# Patient Record
Sex: Female | Born: 1960 | Race: White | Hispanic: No | Marital: Married
Health system: Southern US, Community
[De-identification: ages and names within clinical notes are randomized; demographics above are authoritative.]

## PROBLEM LIST (undated history)

## (undated) DIAGNOSIS — J302 Other seasonal allergic rhinitis: Secondary | ICD-10-CM

## (undated) DIAGNOSIS — I10 Essential (primary) hypertension: Secondary | ICD-10-CM

---

## 2006-04-03 ENCOUNTER — Ambulatory Visit: Payer: Self-pay | Admitting: Specialist

## 2006-04-26 ENCOUNTER — Ambulatory Visit: Payer: Self-pay | Admitting: Internal Medicine

## 2007-06-07 ENCOUNTER — Emergency Department: Payer: Self-pay | Admitting: Emergency Medicine

## 2007-06-11 ENCOUNTER — Emergency Department: Payer: Self-pay | Admitting: Emergency Medicine

## 2008-01-24 ENCOUNTER — Ambulatory Visit: Payer: Self-pay | Admitting: Unknown Physician Specialty

## 2008-02-05 ENCOUNTER — Ambulatory Visit: Payer: Self-pay | Admitting: Unknown Physician Specialty

## 2010-11-03 ENCOUNTER — Ambulatory Visit: Payer: Self-pay | Admitting: Family Medicine

## 2011-09-09 ENCOUNTER — Ambulatory Visit: Payer: Self-pay | Admitting: Medical

## 2015-07-21 ENCOUNTER — Other Ambulatory Visit: Payer: Self-pay | Admitting: Unknown Physician Specialty

## 2015-07-21 DIAGNOSIS — Z1231 Encounter for screening mammogram for malignant neoplasm of breast: Secondary | ICD-10-CM

## 2017-05-15 ENCOUNTER — Other Ambulatory Visit: Payer: Self-pay | Admitting: Medical Oncology

## 2017-05-15 DIAGNOSIS — Z1239 Encounter for other screening for malignant neoplasm of breast: Secondary | ICD-10-CM

## 2018-10-29 ENCOUNTER — Other Ambulatory Visit: Payer: Self-pay | Admitting: Medical Oncology

## 2018-10-29 DIAGNOSIS — Z1231 Encounter for screening mammogram for malignant neoplasm of breast: Secondary | ICD-10-CM

## 2018-12-20 ENCOUNTER — Other Ambulatory Visit: Payer: Self-pay

## 2018-12-20 DIAGNOSIS — Z20822 Contact with and (suspected) exposure to covid-19: Secondary | ICD-10-CM

## 2018-12-22 LAB — NOVEL CORONAVIRUS, NAA: SARS-CoV-2, NAA: NOT DETECTED

## 2019-03-11 ENCOUNTER — Emergency Department
Admission: EM | Admit: 2019-03-11 | Discharge: 2019-03-11 | Disposition: A | Discharge status: Home / Self Care | Payer: BLUE CROSS/BLUE SHIELD | Attending: Emergency Medicine | Admitting: Emergency Medicine

## 2019-03-11 ENCOUNTER — Emergency Department: Payer: BLUE CROSS/BLUE SHIELD

## 2019-03-11 ENCOUNTER — Other Ambulatory Visit: Payer: Self-pay

## 2019-03-11 DIAGNOSIS — Y998 Other external cause status: Secondary | ICD-10-CM | POA: Insufficient documentation

## 2019-03-11 DIAGNOSIS — S022XXA Fracture of nasal bones, initial encounter for closed fracture: Secondary | ICD-10-CM | POA: Insufficient documentation

## 2019-03-11 DIAGNOSIS — R42 Dizziness and giddiness: Secondary | ICD-10-CM | POA: Diagnosis not present

## 2019-03-11 DIAGNOSIS — W19XXXA Unspecified fall, initial encounter: Secondary | ICD-10-CM

## 2019-03-11 DIAGNOSIS — Y92018 Other place in single-family (private) house as the place of occurrence of the external cause: Secondary | ICD-10-CM | POA: Diagnosis not present

## 2019-03-11 DIAGNOSIS — Y9301 Activity, walking, marching and hiking: Secondary | ICD-10-CM | POA: Diagnosis not present

## 2019-03-11 DIAGNOSIS — S0181XA Laceration without foreign body of other part of head, initial encounter: Secondary | ICD-10-CM | POA: Diagnosis not present

## 2019-03-11 DIAGNOSIS — S022XXB Fracture of nasal bones, initial encounter for open fracture: Secondary | ICD-10-CM

## 2019-03-11 DIAGNOSIS — W108XXA Fall (on) (from) other stairs and steps, initial encounter: Secondary | ICD-10-CM | POA: Diagnosis not present

## 2019-03-11 DIAGNOSIS — S0990XA Unspecified injury of head, initial encounter: Secondary | ICD-10-CM | POA: Diagnosis present

## 2019-03-11 DIAGNOSIS — R112 Nausea with vomiting, unspecified: Secondary | ICD-10-CM | POA: Insufficient documentation

## 2019-03-11 LAB — BASIC METABOLIC PANEL
Anion gap: 8 (ref 5–15)
BUN: 15 mg/dL (ref 6–20)
CO2: 25 mmol/L (ref 22–32)
Calcium: 9.2 mg/dL (ref 8.9–10.3)
Chloride: 103 mmol/L (ref 98–111)
Creatinine, Ser: 0.68 mg/dL (ref 0.44–1.00)
GFR calc Af Amer: >60 mL/min (ref >60)
GFR calc non Af Amer: >60 mL/min (ref >60)
Glucose, Bld: 96 mg/dL (ref 70–99)
Potassium: 3.9 mmol/L (ref 3.5–5.1)
Sodium: 136 mmol/L (ref 135–145)

## 2019-03-11 LAB — CBC
HCT: 38.2 % (ref 36.0–46.0)
Hemoglobin: 12.9 g/dL (ref 12.0–15.0)
MCH: 29.7 pg (ref 26.0–34.0)
MCHC: 33.8 g/dL (ref 30.0–36.0)
MCV: 88 fL (ref 80.0–100.0)
Platelets: 256 10*3/uL (ref 150–400)
RBC: 4.34 MIL/uL (ref 3.87–5.11)
RDW: 13.1 % (ref 11.5–15.5)
WBC: 5.3 10*3/uL (ref 4.0–10.5)
nRBC: 0 % (ref 0.0–0.2)

## 2019-03-11 MED ORDER — LIDOCAINE-EPINEPHRINE-TETRACAINE (LET) TOPICAL GEL
3.0000 mL | Freq: Once | TOPICAL | Status: AC
  Administered 2019-03-11: 3 mL via TOPICAL
  Filled 2019-03-11: qty 3

## 2019-03-11 MED ORDER — AMOXICILLIN-POT CLAVULANATE 875-125 MG PO TABS: 1.0000 | ORAL_TABLET | Freq: Two times a day (BID) | ORAL | 0 refills | Status: AC

## 2019-03-11 MED ORDER — TRAMADOL HCL 50 MG PO TABS: 50.0000 mg | ORAL_TABLET | Freq: Four times a day (QID) | ORAL | 0 refills | Status: DC | PRN

## 2019-03-11 MED ORDER — SODIUM CHLORIDE 0.9 % IV SOLN
1000.0000 mL | Freq: Once | INTRAVENOUS | Status: AC
  Administered 2019-03-11: 1000 mL via INTRAVENOUS

## 2019-03-11 NOTE — ED Notes (Signed)
Called pharmacy and they will send up LET

## 2019-03-11 NOTE — ED Notes (Signed)
MD Kinner at bedside  

## 2019-03-11 NOTE — ED Notes (Signed)
Pt's face cleaned up and 2x2 gauze placed to control bleeding. Pt given new masks

## 2019-03-11 NOTE — ED Notes (Signed)
Pt's face cleaned up and instructed on how to care for site.

## 2019-03-11 NOTE — ED Provider Notes (Signed)
[facility] Emergency Department Provider Note   ____________________________________________    I have reviewed the triage vital signs and the nursing notes.   HISTORY  Chief Complaint Fall     HPI Yolanda Nelson is a 59 y.o. female who presents after a fall.  Patient reports that she was walking quickly down the stairs to her basement to get setting for her son and lost her balance and fell forward onto her face.  She was unable to slow her fall.  She complains of nasal pain.  Upon arrival to the emergency department she reports that she became overly heated because she was wearing a sweater and anxious because of the situation and became very lightheaded.  She denies chest pain.  She did have one episode of nausea and vomiting.  Currently feels much better now that she is resting.  History reviewed. No pertinent past medical history.  There are no problems to display for this patient.   History reviewed. No pertinent surgical history.  Prior to Admission medications   Medication Sig Start Date End Date Taking? Authorizing Provider  amoxicillin-clavulanate (AUGMENTIN) 875-125 MG tablet Take 1 tablet by mouth 2 (two) times daily for 7 days. 03/11/19 03/18/19  Jene Every, MD  traMADol (ULTRAM) 50 MG tablet Take 1 tablet (50 mg total) by mouth every 6 (six) hours as needed. 03/11/19 03/10/20  Jene Every, MD     Allergies Patient has no allergy information on record.  No family history on file.  Social History No drugs or alcohol Review of Systems  Constitutional: No fever/chills Eyes: No visual changes.  ENT: Nasal pain Cardiovascular: Denies chest pain. Respiratory: Denies shortness of breath. Gastrointestinal: No abdominal pain.  Vomiting as above now improved Genitourinary: Negative for groin injury Musculoskeletal: Negative for back pain. Skin: Negative for rash. Neurological: Negative for  headaches   ____________________________________________   PHYSICAL EXAM:  VITAL SIGNS: ED Triage Vitals [03/11/19 1127]  Enc Vitals Group     BP 128/72     Pulse Rate 74     Resp 19     Temp (!) 97.3 F (36.3 C)     Temp src      SpO2 100 %     Weight 76.2 kg (168 lb)     Height 1.549 m (5\' 1" )     Head Circumference      Peak Flow      Pain Score 5     Pain Loc      Pain Edu?      Excl. in GC?     Constitutional: Alert and oriented.  Eyes: Conjunctivae are normal.  Head: Atraumatic. Nose: Approximately 3 cm laceration/flap to the anterior nose Mouth/Throat: Mucous membranes are moist.    Cardiovascular: Normal rate, regular rhythm.  Good peripheral circulation. Respiratory: Normal respiratory effort.  No retractions Gastrointestinal: Soft and nontender. No distention.   Genitourinary: deferred Musculoskeletal:  Warm and well perfused Neurologic:  Normal speech and language. No gross focal neurologic deficits are appreciated.  Skin:  Skin is warm, dry and intact. No rash noted. Psychiatric: Mood and affect are normal. Speech and behavior are normal.  ____________________________________________   LABS (all labs ordered are listed, but only abnormal results are displayed)  Labs Reviewed  CBC  BASIC METABOLIC PANEL   ____________________________________________  EKG  ED ECG REPORT I, , the attending physician, personally viewed and interpreted this ECG.  Date: 03/11/2019  Rhythm: normal sinus rhythm QRS Axis:  normal Intervals: normal ST/T Wave abnormalities: normal Narrative Interpretation: no evidence of acute ischemia  ____________________________________________  RADIOLOGY  CT head and maxillofacial  Left  nasal bone fracture ____________________________________________   PROCEDURES  Procedure(s) performed: Yes  .Marland KitchenLaceration Repair  Date/Time: 03/11/2019 3:56 PM Performed by: Lavonia Drafts, MD Authorized by: Lavonia Drafts, MD   Consent:    Consent obtained:  Verbal   Consent given by:  Patient   Risks discussed:  Infection, pain, poor cosmetic result, poor wound healing and need for additional repair   Alternatives discussed:  No treatment and delayed treatment Anesthesia (see MAR for exact dosages):    Anesthesia method:  Topical application   Topical anesthetic:  LET Laceration details:    Location:  Face   Face location:  Nose   Length (cm):  3 Repair type:    Repair type:  Intermediate Exploration:    Hemostasis achieved with:  Direct pressure   Wound exploration: entire depth of wound probed and visualized     Contaminated: no   Treatment:    Area cleansed with:  Saline and Betadine   Amount of cleaning:  Standard   Irrigation solution:  Sterile saline   Visualized foreign bodies/material removed: no   Skin repair:    Repair method:  Sutures   Suture size:  6-0   Suture material:  Nylon   Number of sutures:  6 Approximation:    Approximation:  Close Post-procedure details:    Dressing:  Sterile dressing   Patient tolerance of procedure:  Tolerated well, no immediate complications     Critical Care performed: No ____________________________________________   INITIAL IMPRESSION / ASSESSMENT AND PLAN / ED COURSE  Pertinent labs & imaging results that were available during my care of the patient were reviewed by me and considered in my medical decision making (see chart for details).  Patient presents after mechanical fall with laceration to the nose, suspect nasal fracture.  Pending imaging, will obtain labs given near syncopal episode in the waiting room likely vasovagal however.  EKG reassuring  Labs unremarkable.  CT demonstrates left nasal bone fracture, laceration will require repair    ____________________________________________   FINAL CLINICAL IMPRESSION(S) / ED DIAGNOSES  Final diagnoses:  Fall, initial encounter  Facial laceration, initial encounter   Open fracture of nasal bone, initial encounter        Note:  This document was prepared using Dragon voice recognition software and may include unintentional dictation errors.   Lavonia Drafts, MD 03/11/19 [id number]

## 2019-03-11 NOTE — ED Notes (Signed)
Per RN Marisue Ivan pt had also vomited following episode

## 2019-03-11 NOTE — ED Notes (Signed)
Patient transported to CT 

## 2019-03-11 NOTE — ED Triage Notes (Signed)
Pt comes via [facility] from home with c/o fall. Pt arrives alert and oriented. Pt has laceration noted to top of nose.   Pt states she was walking down the steps and fell because of her shoes. Pt fell face forward and hit her nose and left eye. Pt was able to walk back up steps and wait for someone to arrive.  Pt arrived and while out in triage passed out after getting hot and dizzy.  Pt also has laceration noted to left eyelid.

## 2019-08-30 ENCOUNTER — Other Ambulatory Visit: Payer: Self-pay

## 2019-08-30 ENCOUNTER — Ambulatory Visit
Admission: RE | Admit: 2019-08-30 | Discharge: 2019-08-30 | Discharge status: Against Medical Advice | Payer: BLUE CROSS/BLUE SHIELD | Source: Ambulatory Visit

## 2019-08-31 ENCOUNTER — Ambulatory Visit
Admission: EM | Admit: 2019-08-31 | Discharge: 2019-08-31 | Disposition: A | Discharge status: Home / Self Care | Payer: BLUE CROSS/BLUE SHIELD | Attending: Emergency Medicine | Admitting: Emergency Medicine

## 2019-08-31 ENCOUNTER — Encounter: Payer: Self-pay | Admitting: Emergency Medicine

## 2019-08-31 ENCOUNTER — Ambulatory Visit
Admission: EM | Admit: 2019-08-31 | Discharge: 2019-08-31 | Discharge status: Against Medical Advice | Payer: BLUE CROSS/BLUE SHIELD

## 2019-08-31 ENCOUNTER — Ambulatory Visit
Admission: EM | Admit: 2019-08-31 | Discharge: 2019-08-31 | Discharge status: Against Medical Advice | Payer: BLUE CROSS/BLUE SHIELD | Attending: Emergency Medicine | Admitting: Emergency Medicine

## 2019-08-31 ENCOUNTER — Other Ambulatory Visit: Payer: Self-pay

## 2019-08-31 ENCOUNTER — Ambulatory Visit (INDEPENDENT_AMBULATORY_CARE_PROVIDER_SITE_OTHER): Payer: BLUE CROSS/BLUE SHIELD

## 2019-08-31 DIAGNOSIS — H6122 Impacted cerumen, left ear: Secondary | ICD-10-CM | POA: Diagnosis not present

## 2019-08-31 DIAGNOSIS — J40 Bronchitis, not specified as acute or chronic: Secondary | ICD-10-CM

## 2019-08-31 DIAGNOSIS — J329 Chronic sinusitis, unspecified: Secondary | ICD-10-CM

## 2019-08-31 HISTORY — DX: Other seasonal allergic rhinitis: J30.2

## 2019-08-31 HISTORY — DX: Essential (primary) hypertension: I10

## 2019-08-31 MED ORDER — PREDNISONE 10 MG (21) PO TBPK: ORAL_TABLET | ORAL | 0 refills | Status: DC

## 2019-08-31 MED ORDER — ALBUTEROL SULFATE HFA 108 (90 BASE) MCG/ACT IN AERS: 2.0000 | INHALATION_SPRAY | Freq: Four times a day (QID) | RESPIRATORY_TRACT | 0 refills | Status: AC | PRN

## 2019-08-31 NOTE — ED Provider Notes (Signed)
[facility] [place]   Name: Yolanda Nelson DOB: 01-Sep-1960 MRN: [id number] CSN: [id number] PCP: Clent Jacks, Cordelia Poche  Arrival date and time:  08/31/19 0845  Chief Complaint:  Cough   NOTE: Prior to seeing the patient today, I have reviewed the triage nursing documentation and vital signs. Clinical staff has updated patient's PMH/PSHx, current medication list, and drug allergies/intolerances to ensure comprehensive history available to assist in medical decision making.   History:   HPI: Yolanda Nelson is a 59 y.o. female who presents today with complaints of a productive cough for the past 3 days.  Patient states he has a history of bronchitis and this happens a lot during the summertime.  She believe this is due to the increased moisture in the air that causes irritation to her sinuses and chest.  She has tried treating this productive cough with over-the-counter Mucinex, Sudafed, and Chlortab.  She has noticed minimal to no results with this.  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  She denies any recent contacts with anyone possible COVID-19.  She was concerned over her symptoms, so she completed a rapid COVID-19 test yesterday which was negative.  She has yet to receive her COVID-19 vaccination.  She denies any fevers, chest pain, limiting shortness of breath, or fatigue.  Past Medical History:  Diagnosis Date  . Hypertension   . Seasonal allergies     Past Surgical History:  Procedure Laterality Date  . CESAREAN SECTION      Family History  Problem Relation Age of Onset  . Ovarian cancer Mother   . Stroke Father   . Diabetes Father     Social History   Tobacco Use  . Smoking status: Never Smoker  . Smokeless tobacco: Never Used  Vaping Use  . Vaping Use: Never used  Substance Use Topics  . Alcohol use: Yes    Comment: rarely  . Drug use: Never    There are no problems to display for this patient.   Home  Medications:    Current Meds  Medication Sig  . albuterol (VENTOLIN HFA) 108 (90 Base) MCG/ACT inhaler Inhale 2 puffs into the lungs every 6 (six) hours as needed.  . cetirizine (ZYRTEC) 10 MG tablet Take 1 tablet by mouth daily.  . Cholecalciferol 125 MCG (5000 UT) TABS Take 1 tablet by mouth daily.  . fluticasone (FLONASE) 50 MCG/ACT nasal spray Place into both nostrils.  Marland Kitchen lisinopril (ZESTRIL) 5 MG tablet Take 5 mg by mouth daily.  . montelukast (SINGULAIR) 10 MG tablet Take 10 mg by mouth at bedtime.    Allergies:   Lac bovis and Thimerosal  Review of Systems (ROS): Review of Systems  Constitutional: Negative for activity change, appetite change, chills, diaphoresis and fatigue.  HENT: Positive for postnasal drip, sinus pressure and sore throat. Negative for congestion, sinus pain and sneezing.   Respiratory: Positive for cough and shortness of breath. Negative for wheezing.   Cardiovascular: Negative for chest pain.  Gastrointestinal: Negative for diarrhea, nausea and vomiting.  All other systems reviewed and are negative.    Vital Signs: Today's Vitals   08/31/19 0854 08/31/19 0855  BP:  136/80  Pulse:  81  Resp:  18  Temp:  98.3 F (36.8 C)  TempSrc:  Oral  SpO2:  97%  Weight:  190 lb (86.2 kg)  Height:  5\' 2"  (1.575 m)  PainSc: 0-No pain  Physical Exam: Physical Exam Vitals and nursing note reviewed.  Constitutional:      Appearance: Normal appearance.  HENT:     Right Ear: Tympanic membrane normal.     Left Ear: There is impacted cerumen.     Mouth/Throat:     Pharynx: Oropharynx is clear. No oropharyngeal exudate.  Cardiovascular:     Rate and Rhythm: Normal rate and regular rhythm.     Pulses: Normal pulses.     Heart sounds: Normal heart sounds.  Pulmonary:     Effort: Pulmonary effort is normal.     Breath sounds: Normal breath sounds.  Neurological:     Mental Status: She is alert.      Urgent Care Treatments / Results:   LABS: PLEASE  NOTE: all labs that were ordered this encounter are listed, however only abnormal results are displayed. Labs Reviewed - No data to display  EKG: -None  RADIOLOGY: DG Chest 2 View  Result Date: 08/31/2019 CLINICAL DATA:  Productive cough for 4 days. EXAM: CHEST - 2 VIEW COMPARISON:  06/07/2007 FINDINGS: The heart size and mediastinal contours are within normal limits. Scarring is noted in the lower lungs, left side greater than right. No evidence of pulmonary infiltrate or edema. No evidence of pleural effusion. The visualized skeletal structures are unremarkable. IMPRESSION: No active cardiopulmonary disease. Electronically Signed   By: Danae Orleans M.D.   On: 08/31/2019 09:42    PROCEDURES: Ear Cerumen Removal  Date/Time: 08/31/2019 9:19 AM Performed by: Bailey Mech, NP Authorized by: Bailey Mech, NP   Consent:    Consent obtained:  Verbal   Consent given by:  Patient   Risks discussed:  Incomplete removal and pain   Alternatives discussed:  No treatment Procedure details:    Location:  L ear   Procedure type: irrigation   Post-procedure details:    Inspection:  TM intact   Hearing quality:  Improved   Patient tolerance of procedure:  Tolerated well, no immediate complications    MEDICATIONS RECEIVED THIS VISIT: Medications - No data to display  PERTINENT CLINICAL COURSE NOTES/UPDATES:   Initial Impression / Assessment and Plan / Urgent Care Course:  Pertinent labs & imaging results that were available during my care of the patient were personally reviewed by me and considered in my medical decision making (see lab/imaging section of note for values and interpretations).  Yolanda Nelson is a 59 y.o. female who presents to [facility] today with complaints of productive cough, diagnosed with sinobronchitis, and treated as such with the medications below. NP and patient reviewed discharge instructions below during visit.   Patient is well appearing overall in  clinic today. She does not appear to be in any acute distress. Presenting symptoms (see HPI) and exam as documented above.   I have reviewed the follow up and strict return precautions for any new or worsening symptoms. Patient is aware of symptoms that would be deemed urgent/emergent, and would thus require further evaluation either here or in the emergency department. At the time of discharge, she verbalized understanding and consent with the discharge plan as it was reviewed with her. All questions were fielded by provider and/or clinic staff prior to patient discharge.    Final Clinical Impressions / Urgent Care Diagnoses:   Final diagnoses:  Sinobronchitis    New Prescriptions:  [place] Controlled Substance Registry consulted? Not Applicable  Meds ordered this encounter  Medications  . albuterol (VENTOLIN HFA) 108 (90 Base) MCG/ACT inhaler  Sig: Inhale 2 puffs into the lungs every 6 (six) hours as needed.    Dispense:  6.7 g    Refill:  0  . predniSONE (STERAPRED UNI-PAK 21 TAB) 10 MG (21) TBPK tablet    Sig: Take as instructed on package (60, 50, 40, 30, 20, 10)    Dispense:  1 each    Refill:  0      Discharge Instructions     You were seen for a cough and are being treated for sinobronchitis.   -Make sure you use your cetirizine, fluticasone, and montelukast daily -A humidifier at nighttime may help with your symptoms -Monitor your symptoms to make sure there is symptoms resolved in the upcoming days. -If your symptoms do not resolve or get worse in about a week, you may need reevaluation  Dr. Marland Kitchen, NP-c      Recommended Follow up Care:  Patient encouraged to follow up with the following provider within the specified time frame, or sooner as dictated by the severity of her symptoms. As always, she was instructed that for any urgent/emergent care needs, she should seek care either here or in the emergency department for more immediate evaluation.   Gertie Baron, DNP, NP-c    Gertie Baron, NP 08/31/19 [phone]

## 2019-08-31 NOTE — ED Triage Notes (Signed)
Patient in today c/o productive cough (green) x 3-4 days. Patient denies fever. Patient has been taking Mucinex and Sudafed. Patient has not had covid vaccine. Patient did have a rapid covid test yesterday and it was negative.

## 2019-08-31 NOTE — Discharge Instructions (Addendum)
You were seen for a cough and are being treated for sinobronchitis.   -Make sure you use your cetirizine, fluticasone, and montelukast daily -A humidifier at nighttime may help with your symptoms -Monitor your symptoms to make sure there is symptoms resolved in the upcoming days. -If your symptoms do not resolve or get worse in about a week, you may need reevaluation  Dr. Sharlet Salina, NP-c

## 2019-09-25 ENCOUNTER — Ambulatory Visit
Admission: RE | Admit: 2019-09-25 | Discharge: 2019-09-25 | Disposition: A | Discharge status: Home / Self Care | Payer: BLUE CROSS/BLUE SHIELD | Source: Ambulatory Visit | Attending: Family Medicine | Admitting: Family Medicine

## 2019-09-25 ENCOUNTER — Other Ambulatory Visit: Payer: Self-pay

## 2019-09-25 VITALS — BP 116/89 | HR 84 | Temp 98.4°F | Resp 18 | Ht 62.0 in | Wt 190.0 lb

## 2019-09-25 DIAGNOSIS — J209 Acute bronchitis, unspecified: Secondary | ICD-10-CM | POA: Diagnosis not present

## 2019-09-25 MED ORDER — AZITHROMYCIN 250 MG PO TABS: ORAL_TABLET | ORAL | 0 refills | Status: DC

## 2019-09-25 NOTE — Discharge Instructions (Signed)
Antibiotic as prescribed.  Follow up with your PCP.  Take care  Dr. Adriana Simas

## 2019-09-25 NOTE — ED Triage Notes (Signed)
Pt c/o productive cough. She states she was seen about a month ago for the same thing and she states she did improve some but never completely. She states she is also very tired.

## 2019-09-25 NOTE — ED Provider Notes (Signed)
[facility]    CSN: [id number] Arrival date & time: 09/25/19  6967      History   Chief Complaint Chief Complaint  Patient presents with  . Appointment  . Cough   HPI  59 year old female presents with cough.  Patient reports that she has had ongoing cough for the past month.  She states that her cough is productive.  Severe at times.  Was seen here on 6/26.  Diagnosed with sinobronchitis and was placed on albuterol and prednisone.  Patient states that she has not significantly improved.  Feels fatigued.  No fever.  She continues on [employer] and cough medication at night.  No other associated symptoms.  No other complaints.  Past Medical History:  Diagnosis Date  . Hypertension   . Seasonal allergies    Past Surgical History:  Procedure Laterality Date  . CESAREAN SECTION      OB History   No obstetric history on file.      Home Medications    Prior to Admission medications   Medication Sig Start Date End Date Taking? Authorizing Provider  albuterol (VENTOLIN HFA) 108 (90 Base) MCG/ACT inhaler Inhale 2 puffs into the lungs every 6 (six) hours as needed. 08/31/19  Yes Bailey Mech, NP  cetirizine (ZYRTEC) 10 MG tablet Take 1 tablet by mouth daily.   Yes [provider]  Cholecalciferol 125 MCG (5000 UT) TABS Take 1 tablet by mouth daily.   Yes [provider]  lisinopril (ZESTRIL) 5 MG tablet Take 5 mg by mouth daily. 07/29/19  Yes [provider]  montelukast (SINGULAIR) 10 MG tablet Take 10 mg by mouth at bedtime. 07/17/19  Yes [provider]  azithromycin (ZITHROMAX) 250 MG tablet 2 tablets on day 1, then 1 tablet daily on days 2-5. 09/25/19   Tommie Sams, DO  B Complex Vitamins (VITAMIN B COMPLEX 100 IJ) Take by mouth.    [provider]  triamcinolone (NASACORT) 55 MCG/ACT AERO nasal inhaler Place into the nose.    [provider]  fluticasone (FLONASE) 50 MCG/ACT nasal spray Place into  both nostrils. 07/04/19 09/25/19  [provider]    Family History Family History  Problem Relation Age of Onset  . Ovarian cancer Mother   . Stroke Father   . Diabetes Father     Social History Social History   Tobacco Use  . Smoking status: Never Smoker  . Smokeless tobacco: Never Used  Vaping Use  . Vaping Use: Never used  Substance Use Topics  . Alcohol use: Yes    Comment: rarely  . Drug use: Never     Allergies   Lac bovis and Thimerosal   Review of Systems Review of Systems  Constitutional: Positive for fatigue. Negative for fever.  Respiratory: Positive for cough.    Physical Exam Triage Vital Signs ED Triage Vitals  Enc Vitals Group     BP 09/25/19 1002 116/89     Pulse Rate 09/25/19 1002 84     Resp 09/25/19 1002 18     Temp 09/25/19 1002 98.4 F (36.9 C)     Temp Source 09/25/19 1002 Oral     SpO2 09/25/19 1002 97 %     Weight 09/25/19 0958 190 lb 0.6 oz (86.2 kg)     Height 09/25/19 0958 5\' 2"  (1.575 m)     Head Circumference --      Peak Flow --      Pain Score 09/25/19 0958  0     Pain Loc --      Pain Edu? --      Excl. in GC? --    Updated Vital Signs BP 116/89 (BP Location: Right Arm)   Pulse 84   Temp 98.4 F (36.9 C) (Oral)   Resp 18   Ht 5\' 2"  (1.575 m)   Wt 86.2 kg   SpO2 97%   BMI 34.76 kg/m   Visual Acuity Right Eye Distance:   Left Eye Distance:   Bilateral Distance:    Right Eye Near:   Left Eye Near:    Bilateral Near:     Physical Exam Vitals and nursing note reviewed.  Constitutional:      General: She is not in acute distress.    Appearance: Normal appearance. She is not ill-appearing.  HENT:     Head: Normocephalic and atraumatic.  Eyes:     General:        Right eye: No discharge.        Left eye: No discharge.     Conjunctiva/sclera: Conjunctivae normal.  Cardiovascular:     Rate and Rhythm: Normal rate and regular rhythm.  Pulmonary:     Effort: Pulmonary effort is normal.     Breath  sounds: Normal breath sounds. No wheezing, rhonchi or rales.  Neurological:     Mental Status: She is alert.  Psychiatric:        Mood and Affect: Mood normal.        Behavior: Behavior normal.    UC Treatments / Results  Labs (all labs ordered are listed, but only abnormal results are displayed) Labs Reviewed - No data to display  EKG   Radiology No results found.  Procedures Procedures (including critical care time)  Medications Ordered in UC Medications - No data to display  Initial Impression / Assessment and Plan / UC Course  I have reviewed the triage vital signs and the nursing notes.  Pertinent labs & imaging results that were available during my care of the patient were reviewed by me and considered in my medical decision making (see chart for details).    59 year old female presents with bronchitis.  Persistent symptoms.  Has not improved with symptomatic treatment and supportive care.  Placing empirically on azithromycin.  Advise follow-up with PCP if persists.  Final Clinical Impressions(s) / UC Diagnoses   Final diagnoses:  Acute bronchitis, unspecified organism     Discharge Instructions     Antibiotic as prescribed.  Follow up with your PCP.  Take care  Dr. 46    ED Prescriptions    Medication Sig Dispense Auth. Provider   azithromycin (ZITHROMAX) 250 MG tablet 2 tablets on day 1, then 1 tablet daily on days 2-5. 6 tablet Adriana Simas, DO     PDMP not reviewed this encounter.   Tommie Sams, Tommie Sams 09/25/19 1103

## 2019-11-01 ENCOUNTER — Ambulatory Visit (INDEPENDENT_AMBULATORY_CARE_PROVIDER_SITE_OTHER): Payer: BLUE CROSS/BLUE SHIELD

## 2019-11-01 ENCOUNTER — Inpatient Hospital Stay
Admission: EM | Admit: 2019-11-01 | Discharge: 2019-11-07 | DRG: 177 | Disposition: A | Discharge status: Home / Self Care | Payer: BLUE CROSS/BLUE SHIELD | Attending: Internal Medicine | Admitting: Internal Medicine

## 2019-11-01 ENCOUNTER — Other Ambulatory Visit: Payer: Self-pay

## 2019-11-01 ENCOUNTER — Ambulatory Visit
Admission: RE | Admit: 2019-11-01 | Discharge: 2019-11-01 | Disposition: A | Discharge status: Other Acute Inpatient Hospital | Payer: BLUE CROSS/BLUE SHIELD | Source: Ambulatory Visit | Attending: Internal Medicine | Admitting: Internal Medicine

## 2019-11-01 VITALS — BP 107/79 | HR 88 | Temp 98.4°F | Resp 16 | Ht 61.0 in | Wt 190.0 lb

## 2019-11-01 DIAGNOSIS — J1282 Pneumonia due to coronavirus disease 2019: Secondary | ICD-10-CM | POA: Diagnosis not present

## 2019-11-01 DIAGNOSIS — R0602 Shortness of breath: Secondary | ICD-10-CM | POA: Diagnosis not present

## 2019-11-01 DIAGNOSIS — J9601 Acute respiratory failure with hypoxia: Secondary | ICD-10-CM | POA: Diagnosis not present

## 2019-11-01 DIAGNOSIS — R0902 Hypoxemia: Secondary | ICD-10-CM | POA: Diagnosis not present

## 2019-11-01 DIAGNOSIS — J302 Other seasonal allergic rhinitis: Secondary | ICD-10-CM | POA: Diagnosis not present

## 2019-11-01 DIAGNOSIS — I10 Essential (primary) hypertension: Secondary | ICD-10-CM | POA: Diagnosis not present

## 2019-11-01 DIAGNOSIS — Z8041 Family history of malignant neoplasm of ovary: Secondary | ICD-10-CM | POA: Diagnosis not present

## 2019-11-01 DIAGNOSIS — Z79899 Other long term (current) drug therapy: Secondary | ICD-10-CM

## 2019-11-01 DIAGNOSIS — Z888 Allergy status to other drugs, medicaments and biological substances status: Secondary | ICD-10-CM | POA: Diagnosis not present

## 2019-11-01 DIAGNOSIS — U071 COVID-19: Secondary | ICD-10-CM

## 2019-11-01 DIAGNOSIS — Z823 Family history of stroke: Secondary | ICD-10-CM | POA: Diagnosis not present

## 2019-11-01 DIAGNOSIS — Z833 Family history of diabetes mellitus: Secondary | ICD-10-CM

## 2019-11-01 DIAGNOSIS — R05 Cough: Secondary | ICD-10-CM

## 2019-11-01 DIAGNOSIS — R0981 Nasal congestion: Secondary | ICD-10-CM | POA: Diagnosis not present

## 2019-11-01 DIAGNOSIS — Z7951 Long term (current) use of inhaled steroids: Secondary | ICD-10-CM

## 2019-11-01 DIAGNOSIS — J9621 Acute and chronic respiratory failure with hypoxia: Secondary | ICD-10-CM | POA: Diagnosis not present

## 2019-11-01 DIAGNOSIS — F419 Anxiety disorder, unspecified: Secondary | ICD-10-CM | POA: Diagnosis present

## 2019-11-01 LAB — COMPREHENSIVE METABOLIC PANEL
ALT: 21 U/L (ref 0–44)
AST: 46 U/L — ABNORMAL HIGH (ref 15–41)
Albumin: 3.6 g/dL (ref 3.5–5.0)
Alkaline Phosphatase: 40 U/L (ref 38–126)
Anion gap: 10 (ref 5–15)
BUN: 12 mg/dL (ref 6–20)
CO2: 28 mmol/L (ref 22–32)
Calcium: 8.7 mg/dL — ABNORMAL LOW (ref 8.9–10.3)
Chloride: 98 mmol/L (ref 98–111)
Creatinine, Ser: 0.76 mg/dL (ref 0.44–1.00)
GFR calc Af Amer: >60 mL/min (ref >60)
GFR calc non Af Amer: >60 mL/min (ref >60)
Glucose, Bld: 106 mg/dL — ABNORMAL HIGH (ref 70–99)
Potassium: 4.1 mmol/L (ref 3.5–5.1)
Sodium: 136 mmol/L (ref 135–145)
Total Bilirubin: 0.6 mg/dL (ref 0.3–1.2)
Total Protein: 7.5 g/dL (ref 6.5–8.1)

## 2019-11-01 LAB — CBC WITH DIFFERENTIAL/PLATELET
Abs Immature Granulocytes: 0.04 10*3/uL (ref 0.00–0.07)
Basophils Absolute: 0 10*3/uL (ref 0.0–0.1)
Basophils Relative: 0 %
Eosinophils Absolute: 0 10*3/uL (ref 0.0–0.5)
Eosinophils Relative: 0 %
HCT: 36.2 % (ref 36.0–46.0)
Hemoglobin: 11.9 g/dL — ABNORMAL LOW (ref 12.0–15.0)
Immature Granulocytes: 1 %
Lymphocytes Relative: 13 %
Lymphs Abs: 0.9 10*3/uL (ref 0.7–4.0)
MCH: 29.7 pg (ref 26.0–34.0)
MCHC: 32.9 g/dL (ref 30.0–36.0)
MCV: 90.3 fL (ref 80.0–100.0)
Monocytes Absolute: 0.2 10*3/uL (ref 0.1–1.0)
Monocytes Relative: 3 %
Neutro Abs: 5.8 10*3/uL (ref 1.7–7.7)
Neutrophils Relative %: 83 %
Platelets: 227 10*3/uL (ref 150–400)
RBC: 4.01 MIL/uL (ref 3.87–5.11)
RDW: 12.6 % (ref 11.5–15.5)
WBC: 6.9 10*3/uL (ref 4.0–10.5)
nRBC: 0 % (ref 0.0–0.2)

## 2019-11-01 LAB — C-REACTIVE PROTEIN: CRP: 3.9 mg/dL — ABNORMAL HIGH (ref <1.0)

## 2019-11-01 LAB — FERRITIN: Ferritin: 326 ng/mL — ABNORMAL HIGH (ref 11–307)

## 2019-11-01 LAB — ABO/RH: ABO/RH(D): AB POS

## 2019-11-01 LAB — SARS CORONAVIRUS 2 BY RT PCR (HOSPITAL ORDER, PERFORMED IN ~~LOC~~ HOSPITAL LAB): SARS Coronavirus 2: POSITIVE — AB

## 2019-11-01 LAB — LACTIC ACID, PLASMA: Lactic Acid, Venous: 0.8 mmol/L (ref 0.5–1.9)

## 2019-11-01 LAB — TROPONIN I (HIGH SENSITIVITY): Troponin I (High Sensitivity): 7 ng/L (ref <18)

## 2019-11-01 LAB — PROCALCITONIN: Procalcitonin: <0.1 ng/mL

## 2019-11-01 LAB — FIBRIN DERIVATIVES D-DIMER (ARMC ONLY): Fibrin derivatives D-dimer (ARMC): 1644.39 ng/mL (FEU) — ABNORMAL HIGH (ref 0.00–499.00)

## 2019-11-01 MED ORDER — MONTELUKAST SODIUM 10 MG PO TABS
10.0000 mg | ORAL_TABLET | Freq: Every day | ORAL | Status: DC
  Administered 2019-11-01 – 2019-11-06 (×6): 10 mg via ORAL
  Filled 2019-11-01 (×8): qty 1

## 2019-11-01 MED ORDER — ZINC SULFATE 220 (50 ZN) MG PO CAPS
220.0000 mg | ORAL_CAPSULE | Freq: Every day | ORAL | Status: DC
  Administered 2019-11-01 – 2019-11-07 (×7): 220 mg via ORAL
  Filled 2019-11-01 (×7): qty 1

## 2019-11-01 MED ORDER — DEXAMETHASONE SODIUM PHOSPHATE 10 MG/ML IJ SOLN
10.0000 mg | Freq: Once | INTRAMUSCULAR | Status: AC
  Administered 2019-11-01: 10 mg via INTRAVENOUS
  Filled 2019-11-01: qty 1

## 2019-11-01 MED ORDER — LORATADINE 10 MG PO TABS
10.0000 mg | ORAL_TABLET | Freq: Every day | ORAL | Status: DC
  Administered 2019-11-02 – 2019-11-07 (×6): 10 mg via ORAL
  Filled 2019-11-01 (×6): qty 1

## 2019-11-01 MED ORDER — ONDANSETRON HCL 4 MG PO TABS: 4.0000 mg | ORAL_TABLET | Freq: Four times a day (QID) | ORAL | Status: DC | PRN

## 2019-11-01 MED ORDER — ACETAMINOPHEN 325 MG PO TABS
650.0000 mg | ORAL_TABLET | Freq: Four times a day (QID) | ORAL | Status: DC | PRN
  Filled 2019-11-01: qty 2

## 2019-11-01 MED ORDER — SODIUM CHLORIDE 0.9 % IV SOLN
100.0000 mg | Freq: Every day | INTRAVENOUS | Status: AC
  Administered 2019-11-02 – 2019-11-05 (×4): 100 mg via INTRAVENOUS
  Filled 2019-11-01 (×4): qty 20

## 2019-11-01 MED ORDER — ENOXAPARIN SODIUM 40 MG/0.4ML ~~LOC~~ SOLN
40.0000 mg | SUBCUTANEOUS | Status: DC
  Administered 2019-11-01 – 2019-11-06 (×6): 40 mg via SUBCUTANEOUS
  Filled 2019-11-01 (×6): qty 0.4

## 2019-11-01 MED ORDER — SODIUM CHLORIDE 0.9 % IV SOLN
200.0000 mg | Freq: Once | INTRAVENOUS | Status: AC
  Administered 2019-11-01: 200 mg via INTRAVENOUS
  Filled 2019-11-01: qty 200

## 2019-11-01 MED ORDER — ASCORBIC ACID 500 MG PO TABS
500.0000 mg | ORAL_TABLET | Freq: Every day | ORAL | Status: DC
  Administered 2019-11-01 – 2019-11-07 (×7): 500 mg via ORAL
  Filled 2019-11-01 (×7): qty 1

## 2019-11-01 MED ORDER — METHYLPREDNISOLONE SODIUM SUCC 40 MG IJ SOLR
40.0000 mg | Freq: Two times a day (BID) | INTRAMUSCULAR | Status: DC
  Administered 2019-11-01 – 2019-11-07 (×12): 40 mg via INTRAVENOUS
  Filled 2019-11-01 (×12): qty 1

## 2019-11-01 MED ORDER — IPRATROPIUM-ALBUTEROL 20-100 MCG/ACT IN AERS
1.0000 | INHALATION_SPRAY | Freq: Four times a day (QID) | RESPIRATORY_TRACT | Status: DC
  Administered 2019-11-01 – 2019-11-07 (×23): 1 via RESPIRATORY_TRACT
  Filled 2019-11-01: qty 4

## 2019-11-01 MED ORDER — ADULT MULTIVITAMIN W/MINERALS CH
1.0000 | ORAL_TABLET | Freq: Every day | ORAL | Status: DC
  Administered 2019-11-01 – 2019-11-07 (×7): 1 via ORAL
  Filled 2019-11-01 (×7): qty 1

## 2019-11-01 MED ORDER — BARICITINIB 2 MG PO TABS
4.0000 mg | ORAL_TABLET | Freq: Every day | ORAL | Status: DC
  Administered 2019-11-01 – 2019-11-07 (×7): 4 mg via ORAL
  Filled 2019-11-01 (×7): qty 2

## 2019-11-01 MED ORDER — HYDROCOD POLST-CPM POLST ER 10-8 MG/5ML PO SUER
5.0000 mL | Freq: Two times a day (BID) | ORAL | Status: DC
  Administered 2019-11-01 – 2019-11-07 (×12): 5 mL via ORAL
  Filled 2019-11-01 (×13): qty 5

## 2019-11-01 MED ORDER — GUAIFENESIN-DM 100-10 MG/5ML PO SYRP
10.0000 mL | ORAL_SOLUTION | ORAL | Status: DC | PRN
  Administered 2019-11-02 – 2019-11-04 (×4): 10 mL via ORAL
  Filled 2019-11-01 (×5): qty 10

## 2019-11-01 MED ORDER — ONDANSETRON HCL 4 MG/2ML IJ SOLN: 4.0000 mg | Freq: Four times a day (QID) | INTRAMUSCULAR | Status: DC | PRN

## 2019-11-01 MED ORDER — ALBUTEROL SULFATE HFA 108 (90 BASE) MCG/ACT IN AERS
1.0000 | INHALATION_SPRAY | RESPIRATORY_TRACT | Status: DC
  Administered 2019-11-01 – 2019-11-07 (×35): 1 via RESPIRATORY_TRACT
  Filled 2019-11-01: qty 6.7

## 2019-11-01 MED ORDER — ACETAMINOPHEN 650 MG RE SUPP: 650.0000 mg | Freq: Four times a day (QID) | RECTAL | Status: DC | PRN

## 2019-11-01 NOTE — ED Provider Notes (Addendum)
[facility]    CSN: [id number] Arrival date & time: 11/01/19  1114      History   Chief Complaint Chief Complaint  Patient presents with  . Cough  . Shortness of Breath    HPI Yolanda Nelson is a 59 y.o. female. who presents with SOB, mild cough, fatigue x 2 days ago. 1st symptoms with chills was 8/18, and 22nd had covid test and was positive. Has been in bed sleeping a lot and hardly eating. Has not been taking her meds since she had no energy. She feels worse today. Has felt feverish, but her forehead thermometer is not working well. Has not had any appetite x 7 days, but has been making herself drink.     Past Medical History:  Diagnosis Date  . Hypertension   . Seasonal allergies     There are no problems to display for this patient.   Past Surgical History:  Procedure Laterality Date  . CESAREAN SECTION      OB History   No obstetric history on file.      Home Medications    Prior to Admission medications   Medication Sig Start Date End Date Taking? Authorizing Provider  albuterol (VENTOLIN HFA) 108 (90 Base) MCG/ACT inhaler Inhale 2 puffs into the lungs every 6 (six) hours as needed. 08/31/19  Yes Bailey Mech, NP  B Complex Vitamins (VITAMIN B COMPLEX 100 IJ) Take by mouth.   Yes [provider]  cetirizine (ZYRTEC) 10 MG tablet Take 1 tablet by mouth daily.   Yes [provider]  Cholecalciferol 125 MCG (5000 UT) TABS Take 1 tablet by mouth daily.   Yes [provider]  lisinopril (ZESTRIL) 5 MG tablet Take 5 mg by mouth daily. 07/29/19  Yes [provider]  montelukast (SINGULAIR) 10 MG tablet Take 10 mg by mouth at bedtime. 07/17/19  Yes [provider]  triamcinolone (NASACORT) 55 MCG/ACT AERO nasal inhaler Place into the nose.    [provider]  fluticasone (FLONASE) 50 MCG/ACT nasal spray Place into both nostrils. 07/04/19 09/25/19  [provider]    Family History Family  History  Problem Relation Age of Onset  . Ovarian cancer Mother   . Stroke Father   . Diabetes Father     Social History Social History   Tobacco Use  . Smoking status: Never Smoker  . Smokeless tobacco: Never Used  Vaping Use  . Vaping Use: Never used  Substance Use Topics  . Alcohol use: Yes    Comment: rarely  . Drug use: Never     Allergies   Lac bovis and Thimerosal   Review of Systems Review of Systems  Constitutional: Positive for activity change, appetite change, chills, diaphoresis and fatigue.  HENT: Positive for postnasal drip. Negative for congestion, ear discharge, ear pain, rhinorrhea, sore throat and trouble swallowing.   Eyes: Negative for discharge.  Respiratory: Positive for cough and shortness of breath. Negative for chest tightness.   Cardiovascular: Negative for chest pain.  Gastrointestinal: Positive for nausea. Negative for diarrhea and vomiting.  Genitourinary: Negative for difficulty urinating.  Musculoskeletal: Negative for myalgias.  Skin: Negative for rash.  Neurological: Positive for weakness. Negative for headaches.  Hematological: Negative for adenopathy.     Physical Exam Triage Vital Signs ED Triage Vitals  Enc Vitals Group     BP 11/01/19 1124 107/79     Pulse Rate 11/01/19 1124 88     Resp 11/01/19 1124 16  Temp 11/01/19 1124 98.4 F (36.9 C)     Temp Source 11/01/19 1124 Oral     SpO2 11/01/19 1124 92 %     Weight 11/01/19 1121 190 lb (86.2 kg)     Height 11/01/19 1121 5\' 1"  (1.549 m)     Head Circumference --      Peak Flow --      Pain Score 11/01/19 1121 0     Pain Loc --      Pain Edu? --      Excl. in GC? --    No data found.  Updated Vital Signs BP 107/79 (BP Location: Right Arm)   Pulse 88   Temp 98.4 F (36.9 C) (Oral)   Resp 16   Ht 5\' 1"  (1.549 m)   Wt 190 lb (86.2 kg)   SpO2 (!) 88%   BMI 35.90 kg/m   Visual Acuity Right Eye Distance:   Left Eye Distance:   Bilateral Distance:    Right  Eye Near:   Left Eye Near:    Bilateral Near:     Physical Exam Vitals and nursing note reviewed.  Constitutional:      Appearance: She is obese. She is ill-appearing.  HENT:     Head: Atraumatic.     Mouth/Throat:     Comments: Mouth looks dry Eyes:     Extraocular Movements: Extraocular movements intact.     Pupils: Pupils are equal, round, and reactive to light.  Cardiovascular:     Rate and Rhythm: Normal rate and regular rhythm.     Heart sounds: No murmur heard.   Pulmonary:     Effort: Tachypnea present. No respiratory distress.     Breath sounds: No stridor. Examination of the right-lower field reveals rales. Examination of the left-lower field reveals rales. Rales present.     Comments: Seems SOB Musculoskeletal:        General: Normal range of motion.     Cervical back: Normal range of motion and neck supple.  Lymphadenopathy:     Cervical: No cervical adenopathy.  Skin:    General: Skin is warm and dry.     Findings: No rash.  Neurological:     Mental Status: She is alert and oriented to person, place, and time.  Psychiatric:        Mood and Affect: Mood normal.        Behavior: Behavior normal.      UC Treatments / Results  Labs (all labs ordered are listed, but only abnormal results are displayed) Labs Reviewed - No data to display  EKG   Radiology DG Chest 2 View  Result Date: 11/01/2019 CLINICAL DATA:  Patient diagnosed with COVID couple days ago. Patient c/o cough, chest congestion and SOB. EXAM: CHEST - 2 VIEW COMPARISON:  Chest radiograph 08/31/2019 FINDINGS: The heart size and mediastinal contours are within normal limits. There are bilateral infiltrates predominantly in the mid to lower lungs. No pneumothorax or significant pleural effusion. No acute finding in the visualized skeleton. IMPRESSION: Infiltrates in the bilateral mid to lower lungs concerning for multifocal pneumonia. Electronically Signed   By: 11/03/2019 M.D.   On:  11/01/2019 11:48    Procedures Procedures (including critical care time)  Medications Ordered in UC Medications - No data to display  Initial Impression / Assessment and Plan / UC Course  I have reviewed the triage vital signs and the nursing notes. Her walking pulse ox dropped to 88% so she  was placed on O2 2L via nasal canula and EMS called. She will need to be admitted Pertinent  imaging results that were available during my care of the patient were reviewed by me and considered in my medical decision making (see chart for details).  Final Clinical Impressions(s) / UC Diagnoses   Final diagnoses:  Pneumonia due to COVID-19 virus  Shortness of breath  Hypoxemia     Discharge Instructions     You have Covid pneumonia in both your lungs and you need Oxygen and get admitted to the hospital since your oxygen is dropping with activity.     ED Prescriptions    None     PDMP not reviewed this encounter.   Garey Ham, PA-C 11/01/19 1303    Rodriguez-Southworth, [place], PA-C 11/01/19 1317

## 2019-11-01 NOTE — ED Triage Notes (Signed)
Pt to ED via EMS from home. Pt states she was dx with covid on the 22nd, pt states symptoms have worsened over past few days, pt is weak and has loss of appetite. Pt does not normally wear o2, room air sats on 91%, placed on 2L. Denies pain.

## 2019-11-01 NOTE — ED Notes (Signed)
Ambulatory sat 85% on room air

## 2019-11-01 NOTE — ED Triage Notes (Signed)
Patient diagnosed with COVID couple days ago.  Patient c/o cough, chest congestion and SOB.

## 2019-11-01 NOTE — H&P (Addendum)
History and Physical    [name] AXE:[id number] DOB: 02-03-1961 DOA: 11/01/2019  PCP: Patient, No Pcp Per   Patient coming from: Home   Chief Complaint: Dyspnea  HPI: Yolanda Nelson is a 59 y.o. female with medical history significant of hypertension who presents with dyspnea.  Patient not feeling well since August 18, positive aches and pains, malaise and generalized weakness.  4 days later on August 22 she tested positive for COVID-19.  For the last 5 days her symptoms continue to deteriorate, with decreased p.o. intake, fevers, and chills.  Positive decreased energy, then she has been mostly laying in bed.   For the last 48 hours she has been experiencing dyspnea, moderate to severe in intensity, associated with dry cough, no improving or worsening factors.  Today she went to urgent care, she was found hypoxic 88% on room air on ambulation.  A chest radiograph was performed revealing bilateral interstitial infiltrates.  Patient was placed on 4 L of supplemental oxygen per nasal cannula and transported to the emergency department.   ED Course: Patient was found ill looking appearing, patient was persistently hypoxic  Review of Systems:  1. General: positive fevers, and chills, no weight gain or weight loss 2. ENT: No runny nose or sore throat, no hearing disturbances 3. Pulmonary: positive dyspnea, dry cough, no wheezing, intermittemnt hemoptysis 4. Cardiovascular: No angina, claudication, lower extremity edema, pnd or orthopnea 5. Gastrointestinal: No nausea or vomiting, no diarrhea or constipation 6. Hematology: No easy bruisability or frequent infections 7. Urology: No dysuria, hematuria or increased urinary frequency 8. Dermatology: No rashes. 9. Neurology: No seizures or paresthesias 10. Musculoskeletal: No joint pain or deformities  Past Medical History:  Diagnosis Date  . Hypertension   . Seasonal allergies     Past Surgical History:  Procedure Laterality Date  . CESAREAN  SECTION       reports that she has never smoked. She has never used smokeless tobacco. She reports current alcohol use. She reports that she does not use drugs.  Allergies  Allergen Reactions  . Lac Bovis Other (See Comments)    Other reaction(s): Other (See Comments) GiI issues GiI issues   . Thimerosal Other (See Comments)    Other reaction(s): Other (See Comments) Vision disturbances Vision disturbances     Family History  Problem Relation Age of Onset  . Ovarian cancer Mother   . Stroke Father   . Diabetes Father      Prior to Admission medications   Medication Sig Start Date End Date Taking? Authorizing Provider  albuterol (VENTOLIN HFA) 108 (90 Base) MCG/ACT inhaler Inhale 2 puffs into the lungs every 6 (six) hours as needed. 08/31/19   Bailey Mech, NP  B Complex Vitamins (VITAMIN B COMPLEX 100 IJ) Take by mouth.    [provider]  cetirizine (ZYRTEC) 10 MG tablet Take 1 tablet by mouth daily.    [provider]  Cholecalciferol 125 MCG (5000 UT) TABS Take 1 tablet by mouth daily.    [provider]  lisinopril (ZESTRIL) 5 MG tablet Take 5 mg by mouth daily. 07/29/19   [provider]  montelukast (SINGULAIR) 10 MG tablet Take 10 mg by mouth at bedtime. 07/17/19   [provider]  triamcinolone (NASACORT) 55 MCG/ACT AERO nasal inhaler Place into the nose.    [provider]  fluticasone (FLONASE) 50 MCG/ACT nasal spray Place into both nostrils. 07/04/19 09/25/19  [provider]    Physical Exam: Vitals:  11/01/19 1438 11/01/19 1438  BP: 116/75 116/75  Pulse: 85 85  Resp: (!) 28 (!) 24  Temp: 99 F (37.2 C)   TempSrc: Oral   SpO2: 91% 92%  Weight: 86.2 kg   Height: 5\' 1"  (1.549 m)     Vitals:   11/01/19 1438 11/01/19 1438  BP: 116/75 116/75  Pulse: 85 85  Resp: (!) 28 (!) 24  Temp: 99 F (37.2 C)   TempSrc: Oral   SpO2: 91% 92%  Weight: 86.2 kg   Height: 5\' 1"  (1.549 m)    General:  deconditioned, ill looking appearing Neurology: Awake and alert, non focal Head and Neck. Head normocephalic. Neck supple with no adenopathy or thyromegaly.   E ENT: mild pallor, no icterus, oral mucosa dry Cardiovascular: No JVD. S1-S2 present, rhythmic, no gallops, rubs, or murmurs. No lower extremity edema. Pulmonary: positive breath sounds bilaterally. Gastrointestinal. Abdomen soft and non tender Skin. No rashes Musculoskeletal: no joint deformities    Labs on Admission: I have personally reviewed following labs and imaging studies  CBC: Recent Labs  Lab 11/01/19 1447  WBC 6.9  NEUTROABS 5.8  HGB 11.9*  HCT 36.2  MCV 90.3  PLT 227   Basic Metabolic Panel: Recent Labs  Lab 11/01/19 1447  NA 136  K 4.1  CL 98  CO2 28  GLUCOSE 106*  BUN 12  CREATININE 0.76  CALCIUM 8.7*   GFR: Estimated Creatinine Clearance: 75.5 mL/min (by C-G formula based on SCr of 0.76 mg/dL). Liver Function Tests: Recent Labs  Lab 11/01/19 1447  AST 46*  ALT 21  ALKPHOS 40  BILITOT 0.6  PROT 7.5  ALBUMIN 3.6   No results for input(s): LIPASE, AMYLASE in the last 168 hours. No results for input(s): AMMONIA in the last 168 hours. Coagulation Profile: No results for input(s): INR, PROTIME in the last 168 hours. Cardiac Enzymes: No results for input(s): CKTOTAL, CKMB, CKMBINDEX, TROPONINI in the last 168 hours. BNP (last 3 results) No results for input(s): PROBNP in the last 8760 hours. HbA1C: No results for input(s): HGBA1C in the last 72 hours. CBG: No results for input(s): GLUCAP in the last 168 hours. Lipid Profile: No results for input(s): CHOL, HDL, LDLCALC, TRIG, CHOLHDL, LDLDIRECT in the last 72 hours. Thyroid Function Tests: No results for input(s): TSH, T4TOTAL, FREET4, T3FREE, THYROIDAB in the last 72 hours. Anemia Panel: No results for input(s): VITAMINB12, FOLATE, FERRITIN, TIBC, IRON, RETICCTPCT in the last 72 hours. Urine analysis: No results found for:  COLORURINE, APPEARANCEUR, LABSPEC, PHURINE, GLUCOSEU, HGBUR, BILIRUBINUR, KETONESUR, PROTEINUR, UROBILINOGEN, NITRITE, LEUKOCYTESUR  Radiological Exams on Admission: DG Chest 2 View  Result Date: 11/01/2019 CLINICAL DATA:  Patient diagnosed with COVID couple days ago. Patient c/o cough, chest congestion and SOB. EXAM: CHEST - 2 VIEW COMPARISON:  Chest radiograph 08/31/2019 FINDINGS: The heart size and mediastinal contours are within normal limits. There are bilateral infiltrates predominantly in the mid to lower lungs. No pneumothorax or significant pleural effusion. No acute finding in the visualized skeleton. IMPRESSION: Infiltrates in the bilateral mid to lower lungs concerning for multifocal pneumonia. Electronically Signed   By: 11/03/2019 M.D.   On: 11/01/2019 11:48    EKG: Independently reviewed. 86 bpm, normal axis, normal intervals, sinus rhythm, no ST segment or T wave changes.  Assessment/Plan Principal Problem:   Acute respiratory failure with hypoxia (HCC) Active Problems:   Pneumonia due to COVID-19 virus   Essential hypertension   Seasonal allergies  Patient diagnosed with SARS  COVID-19 on August 22, she has developed a viral pneumonia with acute hypoxic respiratory failure. She is ill looking appearing, temperature 99, blood pressure 116/75, respiratory rate 28, oxygen saturation 88 to 91% on room air. She has dry mucous membranes, her lungs had no wheezing, heart S1-S2, present rhythmic, soft abdomen, no lower extremity edema Sodium 136, potassium 4.1, chloride 98, bicarb 28, glucose 106  BUN 12, creatinine 0.76, AST 46, ALT 21, white count 6.9, hemoglobin 9.9, hematocrit 36.2, platelets 227. Her chest radiograph has bilateral interstitial infiltrates, more dense at bases, predominantly right more than left, faint interstitial infiltrates bilateral upper lobes.   Patient will be admitted to the hospital with a working diagnosis of acute hypoxic respiratory failure due to  SARS COVID-19 viral pneumonia.  1. Acute hypoxic respiratory failure due to SARS COVID-19 viral pneumonia. Patient will be admitted to the medical ward, continue supplemental oxygen per nasal cannula, target oxygen saturation more than 88%.  Patient will be placed on antiviral therapy with remdesivir and systemic corticosteroids with methylprednisolone 40 mg intravenously every 12 hours. Aggressive bronchodilator therapy with albuterol and ipratropium, antitussive agents and airway clearing techniques (flutter valve and incentive spirometer). Encourage patient to lay prone at least 16 hours/day, 4 hours at a time. Consult PT/OT for mobility and out of bed to chair tid with meals.   Follow-up inflammatory markers: D-dimer, ferritin, CRP. Continue anticoagulation for DVT prophylaxis.  Considering patient's hypoxemia, bilateral pulmonary infiltrates, tachypnea, will start patient on baricitinib.   In the setting of worsening hypoxic respiratory failure, I explained the patient in detail, the use of advanced therapies:  baricitinib.  I informed her the risks of this interventions including immunosuppression, opportunistic infections, gastrointestinal complications, allergic reactions, and others.  I explained her the experimental nature of this interventions, in the face of current COVID 19 pandemic, along with the potential benefits of improvement of hypoxic respiratory failure and systemic inflammation.  She has agreed to proceed with this therapy.   Will start patient on baricitinib 4 mg daily.    2.  Hypertension.  Hold on antihypertensive agents for now due to risk of hypotension.   3.  Seasonal allergies.  Continue montelukast and loratadine.   Status is: Inpatient  Remains inpatient appropriate because:Inpatient level of care appropriate due to severity of illness   Dispo: The patient is from: Home              Anticipated d/c is to: Home              Anticipated d/c date is: > 3 days               Patient currently is not medically stable to d/c.    DVT prophylaxis: Enoxaparin   Code Status:   full  Family Communication:      Consults called:  None   Admission status:  Inpatient.   Yolanda Nelson Annett Gula MD [facility]   11/01/2019, 4:17 PM

## 2019-11-01 NOTE — ED Notes (Signed)
Pt given water 

## 2019-11-01 NOTE — Discharge Instructions (Signed)
You have Covid pneumonia in both your lungs and you need Oxygen and get admitted to the hospital since your oxygen is dropping with activity.

## 2019-11-01 NOTE — ED Provider Notes (Signed)
[facility] Emergency Department Provider Note   ____________________________________________   First MD Initiated Contact with Patient 11/01/19 1459     (approximate)  I have reviewed the triage vital signs and the nursing notes.   HISTORY  Chief Complaint Shortness of Breath    HPI Yolanda Nelson is a 59 y.o. female with past medical history of hypertension who presents to the ED complaining of shortness of breath. Patient states that she initially developed chills and diffuse body aches on August 18, subsequently tested positive for COVID-19 5 days ago. She was previously having fevers along with nausea and vomiting, but these have resolved. Over the past couple of days, she has had increasing cough and difficulty breathing, particularly with exertion. She denies any associated pain in her chest and has not had any pain or swelling in her legs. She was evaluated at the urgent care today where she was noted to have low O2 sats with ambulation, subsequently referred to the ED for further evaluation.        Past Medical History:  Diagnosis Date  . Hypertension   . Seasonal allergies     There are no problems to display for this patient.   Past Surgical History:  Procedure Laterality Date  . CESAREAN SECTION      Prior to Admission medications   Medication Sig Start Date End Date Taking? Authorizing Provider  albuterol (VENTOLIN HFA) 108 (90 Base) MCG/ACT inhaler Inhale 2 puffs into the lungs every 6 (six) hours as needed. 08/31/19   Bailey Mech, NP  B Complex Vitamins (VITAMIN B COMPLEX 100 IJ) Take by mouth.    [provider]  cetirizine (ZYRTEC) 10 MG tablet Take 1 tablet by mouth daily.    [provider]  Cholecalciferol 125 MCG (5000 UT) TABS Take 1 tablet by mouth daily.    [provider]  lisinopril (ZESTRIL) 5 MG tablet Take 5 mg by mouth daily. 07/29/19   [provider]  montelukast (SINGULAIR) 10 MG  tablet Take 10 mg by mouth at bedtime. 07/17/19   [provider]  triamcinolone (NASACORT) 55 MCG/ACT AERO nasal inhaler Place into the nose.    [provider]  fluticasone (FLONASE) 50 MCG/ACT nasal spray Place into both nostrils. 07/04/19 09/25/19  [provider]    Allergies Lac bovis and Thimerosal  Family History  Problem Relation Age of Onset  . Ovarian cancer Mother   . Stroke Father   . Diabetes Father     Social History Social History   Tobacco Use  . Smoking status: Never Smoker  . Smokeless tobacco: Never Used  Vaping Use  . Vaping Use: Never used  Substance Use Topics  . Alcohol use: Yes    Comment: rarely  . Drug use: Never    Review of Systems  Constitutional: Positive for fever/chills Eyes: No visual changes. ENT: No sore throat. Cardiovascular: Denies chest pain. Respiratory: Positive for cough and shortness of breath. Gastrointestinal: No abdominal pain. Positive for nausea and vomiting.  No diarrhea.  No constipation. Genitourinary: Negative for dysuria. Musculoskeletal: Negative for back pain. Skin: Negative for rash. Neurological: Negative for headaches, focal weakness or numbness.  ____________________________________________   PHYSICAL EXAM:  VITAL SIGNS: ED Triage Vitals  Enc Vitals Group     BP 11/01/19 1438 116/75     Pulse Rate 11/01/19 1438 85     Resp 11/01/19 1438 (!) 28     Temp 11/01/19 1438 99 F (37.2 C)  Temp Source 11/01/19 1438 Oral     SpO2 11/01/19 1438 91 %     Weight 11/01/19 1438 190 lb (86.2 kg)     Height 11/01/19 1438 5\' 1"  (1.549 m)     Head Circumference --      Peak Flow --      Pain Score 11/01/19 1444 0     Pain Loc --      Pain Edu? --      Excl. in GC? --     Constitutional: Alert and oriented. Eyes: Conjunctivae are normal. Head: Atraumatic. Nose: No congestion/rhinnorhea. Mouth/Throat: Mucous membranes are moist. Neck: Normal ROM Cardiovascular: Normal rate,  regular rhythm. Grossly normal heart sounds. Respiratory: Tachypneic with increased respiratory effort.  No retractions. Lungs CTAB. Gastrointestinal: Soft and nontender. No distention. Genitourinary: deferred Musculoskeletal: No lower extremity tenderness nor edema. Neurologic:  Normal speech and language. No gross focal neurologic deficits are appreciated. Skin:  Skin is warm, dry and intact. No rash noted. Psychiatric: Mood and affect are normal. Speech and behavior are normal.  ____________________________________________   LABS (all labs ordered are listed, but only abnormal results are displayed)  Labs Reviewed  CBC WITH DIFFERENTIAL/PLATELET - Abnormal; Notable for the following components:      Result Value   Hemoglobin 11.9 (*)    All other components within normal limits  COMPREHENSIVE METABOLIC PANEL - Abnormal; Notable for the following components:   Glucose, Bld 106 (*)    Calcium 8.7 (*)    AST 46 (*)    All other components within normal limits  SARS CORONAVIRUS 2 BY RT PCR (HOSPITAL ORDER, PERFORMED IN [facility])  LACTIC ACID, PLASMA  LACTIC ACID, PLASMA  PROCALCITONIN  FERRITIN  C-REACTIVE PROTEIN  FIBRIN DERIVATIVES D-DIMER ([facility] ONLY)  TROPONIN I (HIGH SENSITIVITY)   ____________________________________________  EKG  ED ECG REPORT I, 11/03/19, the attending physician, personally viewed and interpreted this ECG.   Date: 11/01/2019  EKG Time: 14:39  Rate: 86  Rhythm: normal sinus rhythm  Axis: Normal  Intervals:none  ST&T Change: None   PROCEDURES  Procedure(s) performed (including Critical Care):  Procedures   ____________________________________________   INITIAL IMPRESSION / ASSESSMENT AND PLAN / ED COURSE       59 year old female with past medical history of no, hypertension presents to the ED complaining of cough and increasing difficulty breathing after testing positive for COVID-19 5 days ago. She is  tachypneic with borderline low O2 sats of 90 to 91% on room air, subsequently dropped to 85% on room air with ambulation. She was placed on supplemental oxygen and we will treat with steroids, PCR testing pending to confirm COVID-19 although chest x-ray appears consistent with a viral pneumonia. Low suspicion for bacterial pneumonia, no leukocytosis noted and we will screen procalcitonin but hold off on antibiotics for now. Remainder of labs thus far are reassuring.    Case discussed with hospitalist for admission, patient remains stable on 2 L nasal cannula.      ____________________________________________   FINAL CLINICAL IMPRESSION(S) / ED DIAGNOSES  Final diagnoses:  Pneumonia due to COVID-19 virus  SOB (shortness of breath)     ED Discharge Orders    None       Note:  This document was prepared using Dragon voice recognition software and may include unintentional dictation errors.   46, MD 11/01/19 1616

## 2019-11-01 NOTE — ED Triage Notes (Signed)
Went to urgent care for sob.  Covid+  cxr showed pneumonia upper lobes.  They sent her here.  On 4 liters now and 97%.  98.4, 128/84 hr87

## 2019-11-02 LAB — FIBRIN DERIVATIVES D-DIMER (ARMC ONLY): Fibrin derivatives D-dimer (ARMC): 1596.19 ng/mL (FEU) — ABNORMAL HIGH (ref 0.00–499.00)

## 2019-11-02 LAB — C-REACTIVE PROTEIN: CRP: 4.8 mg/dL — ABNORMAL HIGH (ref <1.0)

## 2019-11-02 LAB — FERRITIN: Ferritin: 384 ng/mL — ABNORMAL HIGH (ref 11–307)

## 2019-11-02 LAB — HIV ANTIBODY (ROUTINE TESTING W REFLEX): HIV Screen 4th Generation wRfx: NONREACTIVE

## 2019-11-02 MED ORDER — FUROSEMIDE 10 MG/ML IJ SOLN
40.0000 mg | Freq: Once | INTRAMUSCULAR | Status: AC
  Administered 2019-11-02: 40 mg via INTRAVENOUS
  Filled 2019-11-02: qty 4

## 2019-11-02 MED ORDER — BOOST / RESOURCE BREEZE PO LIQD CUSTOM
1.0000 | Freq: Three times a day (TID) | ORAL | Status: DC
  Administered 2019-11-02 – 2019-11-06 (×7): 1 via ORAL

## 2019-11-02 NOTE — Progress Notes (Signed)
PROGRESS NOTE    Yolanda Nelson  FVC:[id number] DOB: 03/08/1960 DOA: 11/01/2019 PCP: Patient, No Pcp Per    Brief Narrative:  HPI: Yolanda Nelson is a 59 y.o. female with medical history significant of hypertension who presents with dyspnea.  Patient not feeling well since August 18, positive aches and pains, malaise and generalized weakness.  4 days later on August 22 she tested positive for COVID-19.  For the last 5 days her symptoms continue to deteriorate, with decreased p.o. intake, fevers, and chills.  Positive decreased energy, then she has been mostly laying in bed.   For the last 48 hours she has been experiencing dyspnea, moderate to severe in intensity, associated with dry cough, no improving or worsening factors.  Today she went to urgent care, she was found hypoxic 88% on room air on ambulation.  A chest radiograph was performed revealing bilateral interstitial infiltrates.  Patient was placed on 4 L of supplemental oxygen per nasal cannula and transported to the emergency department.  8/28: Patient seen and examined.  Remained stable.  On 3 to 4 L nasal cannula.  Endorsing some shortness of breath improving over interval.   Assessment & Plan:   Principal Problem:   Acute respiratory failure with hypoxia (HCC) Active Problems:   Pneumonia due to COVID-19 virus   Essential hypertension   Seasonal allergies  Acute hypoxic respiratory failure secondary to COVID-19 pneumonia Presented with evidence of pneumonia Hypoxic respiratory failure requiring supplemental oxygen Seems to be improving over interval Plan: Continue remdesivir, 11/01/2019- Continue steroids, 11/01/2019- Continue baricitinib, 11/01/2019- Supplemental oxygen, wean as tolerated Bronchodilator therapy via MDI Stress I-S and flutter valve use Prone as tolerated Follow Covid inflammatory markers  Hypertension Home antihypertensive agents held for now  Allergic rhinitis Continue montelukast and  loratadine   DVT prophylaxis: Lovenox Code Status: Full Family Communication: None today Disposition Plan: Status is: Inpatient  Remains inpatient appropriate because:Inpatient level of care appropriate due to severity of illness   Dispo: The patient is from: Home              Anticipated d/c is to: Home              Anticipated d/c date is: 2 days              Patient currently is not medically stable to d/c.  Still hypoxic in setting of COVID-19 pneumonia.  Anticipate additional 48 hours of inpatient monitoring and treatment prior to disposition planning.   Consultants:   None  Procedures:   None  Antimicrobials:   Remdesivir   Subjective: Seen and examined.  Endorses some cough and shortness of breath.  Improving over interval.  Objective: Vitals:   11/01/19 2101 11/01/19 2145 11/02/19 0533 11/02/19 0818  BP: 112/83  107/71 106/71  Pulse: 87  70 74  Resp: 16  18 (!) 22  Temp: 98.9 F (37.2 C)  98.4 F (36.9 C) 98.1 F (36.7 C)  TempSrc: Oral  Oral Oral  SpO2: (!) 88% 93% (!) 86% (!) 85%  Weight:      Height:        Intake/Output Summary (Last 24 hours) at 11/02/2019 1216 Last data filed at 11/02/2019 0300 Gross per 24 hour  Intake 290 ml  Output --  Net 290 ml   Filed Weights   11/01/19 1438  Weight: 86.2 kg    Examination:  General exam: Appears calm and comfortable  Respiratory system: Coarse bibasilar crackles.  Normal work of breathing.  3 L nasal cannula Cardiovascular system: S1 & S2 heard, RRR. No JVD, murmurs, rubs, gallops or clicks. No pedal edema. Gastrointestinal system: Abdomen is nondistended, soft and nontender. No organomegaly or masses felt. Normal bowel sounds heard. Central nervous system: Alert and oriented. No focal neurological deficits. Extremities: Symmetric 5 x 5 power. Skin: No rashes, lesions or ulcers Psychiatry: Judgement and insight appear normal. Mood & affect appropriate.     Data Reviewed: I have personally  reviewed following labs and imaging studies  CBC: Recent Labs  Lab 11/01/19 1447  WBC 6.9  NEUTROABS 5.8  HGB 11.9*  HCT 36.2  MCV 90.3  PLT 227   Basic Metabolic Panel: Recent Labs  Lab 11/01/19 1447  NA 136  K 4.1  CL 98  CO2 28  GLUCOSE 106*  BUN 12  CREATININE 0.76  CALCIUM 8.7*   GFR: Estimated Creatinine Clearance: 75.5 mL/min (by C-G formula based on SCr of 0.76 mg/dL). Liver Function Tests: Recent Labs  Lab 11/01/19 1447  AST 46*  ALT 21  ALKPHOS 40  BILITOT 0.6  PROT 7.5  ALBUMIN 3.6   No results for input(s): LIPASE, AMYLASE in the last 168 hours. No results for input(s): AMMONIA in the last 168 hours. Coagulation Profile: No results for input(s): INR, PROTIME in the last 168 hours. Cardiac Enzymes: No results for input(s): CKTOTAL, CKMB, CKMBINDEX, TROPONINI in the last 168 hours. BNP (last 3 results) No results for input(s): PROBNP in the last 8760 hours. HbA1C: No results for input(s): HGBA1C in the last 72 hours. CBG: No results for input(s): GLUCAP in the last 168 hours. Lipid Profile: No results for input(s): CHOL, HDL, LDLCALC, TRIG, CHOLHDL, LDLDIRECT in the last 72 hours. Thyroid Function Tests: No results for input(s): TSH, T4TOTAL, FREET4, T3FREE, THYROIDAB in the last 72 hours. Anemia Panel: Recent Labs    11/01/19 1448 11/02/19 0351  FERRITIN 326* 384*   Sepsis Labs: Recent Labs  Lab 11/01/19 1448  PROCALCITON <0.10  LATICACIDVEN 0.8    Recent Results (from the past 240 hour(s))  SARS Coronavirus 2 by RT PCR (hospital order, performed in [facility] hospital lab) Nasopharyngeal Nasopharyngeal Swab     Status: Abnormal   Collection Time: 11/01/19  3:05 PM   Specimen: Nasopharyngeal Swab  Result Value Ref Range Status   SARS Coronavirus 2 POSITIVE (A) NEGATIVE Final    Comment: RESULT CALLED TO, READ BACK BY AND VERIFIED WITH: CHRIS BENNETT AT 1753 ON 11/01/19 RWW (NOTE) SARS-CoV-2 target nucleic acids are  DETECTED  SARS-CoV-2 RNA is generally detectable in upper respiratory specimens  during the acute phase of infection.  Positive results are indicative  of the presence of the identified virus, but do not rule out bacterial infection or co-infection with other pathogens not detected by the test.  Clinical correlation with patient history and  other diagnostic information is necessary to determine patient infection status.  The expected result is negative.  Fact Sheet for Patients:   BoilerBrush.com.cy   Fact Sheet for Healthcare Providers:   https://pope.com/    This test is not yet approved or cleared by the [place] FDA and  has been authorized for detection and/or diagnosis of SARS-CoV-2 by FDA under an Emergency Use Authorization (EUA).  This EUA will remain in effect (meaning this  test can be used) for the duration of  the COVID-19 declaration under Section 564(b)(1) of the Act, 21 U.S.C. section 360-bbb-3(b)(1), unless the authorization is terminated or revoked sooner.  Performed at  [facility], [address], [place], [place] [address]          Radiology Studies: DG Chest 2 View  Result Date: 11/01/2019 CLINICAL DATA:  Patient diagnosed with COVID couple days ago. Patient c/o cough, chest congestion and SOB. EXAM: CHEST - 2 VIEW COMPARISON:  Chest radiograph 08/31/2019 FINDINGS: The heart size and mediastinal contours are within normal limits. There are bilateral infiltrates predominantly in the mid to lower lungs. No pneumothorax or significant pleural effusion. No acute finding in the visualized skeleton. IMPRESSION: Infiltrates in the bilateral mid to lower lungs concerning for multifocal pneumonia. Electronically Signed   By: Emmaline Kluver M.D.   On: 11/01/2019 11:48        Scheduled Meds: . albuterol  1 puff Inhalation Q4H  . vitamin C  500 mg Oral Daily  . baricitinib  4 mg Oral Daily  .  chlorpheniramine-HYDROcodone  5 mL Oral Q12H  . enoxaparin (LOVENOX) injection  40 mg Subcutaneous Q24H  . Ipratropium-Albuterol  1 puff Inhalation Q6H  . loratadine  10 mg Oral Daily  . methylPREDNISolone (SOLU-MEDROL) injection  40 mg Intravenous Q12H  . montelukast  10 mg Oral QHS  . multivitamin with minerals  1 tablet Oral Daily  . zinc sulfate  220 mg Oral Daily   Continuous Infusions: . remdesivir 100 mg in NS 100 mL 100 mg (11/02/19 0850)     LOS: 1 day    Time spent: 25 minutes    Tresa Moore, MD [facility] Pager [phone]  If 7PM-7AM, please contact night-coverage 11/02/2019, 12:16 PM

## 2019-11-02 NOTE — Progress Notes (Addendum)
Initial Nutrition Assessment  DOCUMENTATION CODES:   Not applicable  INTERVENTION:    Boost Breeze po TID, each supplement provides 250 kcal and 9 grams of protein  MVI daily   NUTRITION DIAGNOSIS:   Increased nutrient needs related to acute illness as evidenced by estimated needs.  GOAL:   Patient will meet greater than or equal to 90% of their needs  MONITOR:   PO intake, Supplement acceptance, Weight trends, Labs, I & O's  REASON FOR ASSESSMENT:   Malnutrition Screening Tool    ASSESSMENT:   Patient with PMH significant for HTN. Presents this admission with COVID 19 PNA.   Unable to reach pt at this time. Per H&P, pt with decreased intake over the last week due to fatigue and difficulty breathing. No meal intakes documented this admission. RD to provide supplementation to maximize kcal and protein.   Of note: pt has dairy allergy, unable to order Ensure Enlive or ProSource. Will need to confirm this pt. Try Boost Breeze for now.   Weight shows multiple stated weight making it difficult to quantify actual weight loss. Will need to obtain actual weight this admission.   Drips: remdesivir Medications: 500 mg vitamin C, solumedrol, MVI, zinc sulfate 220 mg Labs: CBG 106  Diet Order:   Diet Order            Diet regular Room service appropriate? Yes; Fluid consistency: Thin  Diet effective now                 EDUCATION NEEDS:   Not appropriate for education at this time  Skin:  Skin Assessment: Reviewed RN Assessment  Last BM:  PTA  Height:   Ht Readings from Last 1 Encounters:  11/01/19 5\' 1"  (1.549 m)    Weight:   Wt Readings from Last 1 Encounters:  11/01/19 86.2 kg    BMI:  Body mass index is 35.9 kg/m.  Estimated Nutritional Needs:   Kcal:  1900-2100 kcal  Protein:  95-115 grams  Fluid:  >/= 1.9 L/day  11/03/19 RD, LDN Clinical Nutrition Pager listed in AMION

## 2019-11-02 NOTE — Progress Notes (Signed)
Physical Therapy Evaluation Patient Details Name: Yolanda Nelson MRN: [id number] DOB: May 10, 1960 Today's Date: 11/02/2019   History of Present Illness  Per MD note:Yolanda Nelson is a 59 y.o. female with medical history significant of hypertension who presents with dyspnea.  Patient not feeling well since August 18, positive aches and pains, malaise and generalized weakness.  4 days later on August 22 she tested positive for COVID-19.  For the last 5 days her symptoms continue to deteriorate, with decreased p.o. intake, fevers, and chills.  Positive decreased energy, then she has been mostly laying in bed.    Clinical Impression  Patient agrees to PT evaluation. She is on 4 L O2 and is independent with bed mobility and transfers. She had desaturation to 83% during sitting and standing, so gait not assessed. Her strength is [facility] and balance is WFL. She has no PT skilled needs at this time and no equipment needs.     Follow Up Recommendations No PT follow up    Equipment Recommendations  None recommended by PT    Recommendations for Other Services       Precautions / Restrictions Restrictions Weight Bearing Restrictions: No      Mobility  Bed Mobility Overal bed mobility: Independent                Transfers Overall transfer level: Independent                  Ambulation/Gait Ambulation/Gait assistance:  (NT due to 83% O2 sat)              Stairs            Wheelchair Mobility    Modified Rankin (Stroke Patients Only)       Balance                                             Pertinent Vitals/Pain Pain Assessment: No/denies pain    Home Living Family/patient expects to be discharged to:: Private residence Living Arrangements: Spouse/significant other     Home Access: Stairs to enter   Secretary/administrator of Steps:  (Pt reports she does not know)          Prior Function Level of Independence: Independent                Hand Dominance        Extremity/Trunk Assessment   Upper Extremity Assessment Upper Extremity Assessment: Overall WFL for tasks assessed    Lower Extremity Assessment Lower Extremity Assessment: Overall WFL for tasks assessed       Communication   Communication: No difficulties  Cognition Arousal/Alertness: Awake/alert                                            General Comments      Exercises     Assessment/Plan    PT Assessment Patent does not need any further PT services  PT Problem List         PT Treatment Interventions      PT Goals (Current goals can be found in the Care Plan section)  Acute Rehab PT Goals Patient Stated Goal: no goals stated PT Goal Formulation: All assessment and education complete, DC therapy  Frequency     Barriers to discharge        Co-evaluation               AM-PAC PT "6 Clicks" Mobility  Outcome Measure Help needed turning from your back to your side while in a flat bed without using bedrails?: None Help needed moving from lying on your back to sitting on the side of a flat bed without using bedrails?: None Help needed moving to and from a bed to a chair (including a wheelchair)?: None Help needed standing up from a chair using your arms (e.g., wheelchair or bedside chair)?: None Help needed to walk in hospital room?: None Help needed climbing 3-5 steps with a railing? : None 6 Click Score: 24    End of Session Equipment Utilized During Treatment: Oxygen Activity Tolerance: Treatment limited secondary to medical complications (Comment) Patient left: in bed Nurse Communication: Mobility status      Time: 4818-5631 PT Time Calculation (min) (ACUTE ONLY): 10 min   Charges:   PT Evaluation $PT Eval Low Complexity: 1 Low            Ezekiel Ina, PT DPT 11/02/2019, 4:38 PM

## 2019-11-02 NOTE — Progress Notes (Signed)
OT Cancellation Note  Patient Details Name: Yolanda Nelson MRN: [id number] DOB: 1960/03/09   Cancelled Treatment:    Reason Eval/Treat Not Completed: Fatigue/lethargy limiting ability to participate. Per conversation c PT, pt limited by O2 this PM. Will hold and initiate services at later date/time as able.  Kathie Dike, M.S. OTR/L  11/02/19, 4:27 PM  ascom [phone]

## 2019-11-03 LAB — CBC WITH DIFFERENTIAL/PLATELET
Abs Immature Granulocytes: 0.15 10*3/uL — ABNORMAL HIGH (ref 0.00–0.07)
Basophils Absolute: 0 10*3/uL (ref 0.0–0.1)
Basophils Relative: 0 %
Eosinophils Absolute: 0 10*3/uL (ref 0.0–0.5)
Eosinophils Relative: 0 %
HCT: 35.9 % — ABNORMAL LOW (ref 36.0–46.0)
Hemoglobin: 11.9 g/dL — ABNORMAL LOW (ref 12.0–15.0)
Immature Granulocytes: 1 %
Lymphocytes Relative: 14 %
Lymphs Abs: 1.5 10*3/uL (ref 0.7–4.0)
MCH: 29.5 pg (ref 26.0–34.0)
MCHC: 33.1 g/dL (ref 30.0–36.0)
MCV: 89.1 fL (ref 80.0–100.0)
Monocytes Absolute: 0.7 10*3/uL (ref 0.1–1.0)
Monocytes Relative: 6 %
Neutro Abs: 8.7 10*3/uL — ABNORMAL HIGH (ref 1.7–7.7)
Neutrophils Relative %: 79 %
Platelets: 333 10*3/uL (ref 150–400)
RBC: 4.03 MIL/uL (ref 3.87–5.11)
RDW: 12.3 % (ref 11.5–15.5)
WBC: 11.1 10*3/uL — ABNORMAL HIGH (ref 4.0–10.5)
nRBC: 0 % (ref 0.0–0.2)

## 2019-11-03 LAB — COMPREHENSIVE METABOLIC PANEL
ALT: 28 U/L (ref 0–44)
AST: 39 U/L (ref 15–41)
Albumin: 3.4 g/dL — ABNORMAL LOW (ref 3.5–5.0)
Alkaline Phosphatase: 34 U/L — ABNORMAL LOW (ref 38–126)
Anion gap: 11 (ref 5–15)
BUN: 24 mg/dL — ABNORMAL HIGH (ref 6–20)
CO2: 27 mmol/L (ref 22–32)
Calcium: 9.2 mg/dL (ref 8.9–10.3)
Chloride: 97 mmol/L — ABNORMAL LOW (ref 98–111)
Creatinine, Ser: 0.74 mg/dL (ref 0.44–1.00)
GFR calc Af Amer: >60 mL/min (ref >60)
GFR calc non Af Amer: >60 mL/min (ref >60)
Glucose, Bld: 133 mg/dL — ABNORMAL HIGH (ref 70–99)
Potassium: 4 mmol/L (ref 3.5–5.1)
Sodium: 135 mmol/L (ref 135–145)
Total Bilirubin: 0.6 mg/dL (ref 0.3–1.2)
Total Protein: 7.2 g/dL (ref 6.5–8.1)

## 2019-11-03 LAB — FIBRIN DERIVATIVES D-DIMER (ARMC ONLY): Fibrin derivatives D-dimer (ARMC): 1190.74 ng/mL (FEU) — ABNORMAL HIGH (ref 0.00–499.00)

## 2019-11-03 LAB — FERRITIN: Ferritin: 434 ng/mL — ABNORMAL HIGH (ref 11–307)

## 2019-11-03 LAB — C-REACTIVE PROTEIN: CRP: 2.2 mg/dL — ABNORMAL HIGH (ref <1.0)

## 2019-11-03 MED ORDER — SALINE SPRAY 0.65 % NA SOLN
1.0000 | NASAL | Status: DC | PRN
  Administered 2019-11-04: 1 via NASAL
  Filled 2019-11-03: qty 44

## 2019-11-03 MED ORDER — ALPRAZOLAM 0.25 MG PO TABS
0.2500 mg | ORAL_TABLET | Freq: Two times a day (BID) | ORAL | Status: DC | PRN
  Administered 2019-11-03 – 2019-11-05 (×2): 0.25 mg via ORAL
  Filled 2019-11-03 (×2): qty 1

## 2019-11-03 NOTE — Evaluation (Signed)
Occupational Therapy Evaluation Patient Details Name: Yolanda Nelson MRN: [id number] DOB: [birth year] Today's Date: 11/03/2019    History of Present Illness Per MD note:Yolanda Nelson is a 59 y.o. female with medical history significant of hypertension who presents with dyspnea.  Patient not feeling well since August 18, positive aches and pains, malaise and generalized weakness.  4 days later on August 22 she tested positive for COVID-19.  For the last 5 days her symptoms continue to deteriorate, with decreased p.o. intake, fevers, and chills.  Positive decreased energy, then she has been mostly laying in bed.     Clinical Impression   Yolanda Nelson was seen for OT evaluation this date. Prior to hospital admission, pt was Independent in mobility and I/ADLs. Pt lives c boyfriend in home c 4STE and basement. Pt presents to acute OT grossly Independent for mobility and ADLs. Pt endorses decreased activity tolerance and increased anxiousness. Pt educated in energy conservation strategies including pursed lip breathing, activity pacing, home/routines modifications, work simplification, and prioritizing of meaningful occupations. 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 Upon hospital discharge, anticipate no OT follow up needed. Will sign off at this time. Please re-consult if new needs arise.     Follow Up Recommendations  No OT follow up    Equipment Recommendations  None recommended by OT    Recommendations for Other Services       Precautions / Restrictions Precautions Precautions: None Restrictions Weight Bearing Restrictions: No      Mobility Bed Mobility Overal bed mobility: Independent      Transfers Overall transfer level: Independent         ADL either performed or assessed with clinical judgement   ADL Overall ADL's : Needs  assistance/impaired      General ADL Comments: Independent LB access at bed level. RN and pt endorse Independent bathroom t/fs and mgmt      Pertinent Vitals/Pain Pain Assessment: No/denies pain     Hand Dominance Left   Extremity/Trunk Assessment Upper Extremity Assessment Upper Extremity Assessment: Overall WFL for tasks assessed   Lower Extremity Assessment Lower Extremity Assessment: Overall WFL for tasks assessed       Communication Communication Communication: No difficulties   Cognition Arousal/Alertness: Awake/alert Behavior During Therapy: WFL for tasks assessed/performed Overall Cognitive Status: Within Functional Limits for tasks assessed            Exercises Exercises: Other exercises Other Exercises Other Exercises: Pt educated re: OT role, ECS, falls prevention, anxiety mgmt strategies, importance of mobility for functional stregnthening Other Exercises: sup<>long sitting, side lying<>sit, self-drinking, LB access, simulated UBD   Shoulder Instructions      Home Living Family/patient expects to be discharged to:: Private residence Living Arrangements: Spouse/significant other (boyfriend) Available Help at Discharge: Friend(s);Available 24 hours/day Type of Home: House Home Access: Stairs to enter [employer] of Steps: 4   Home Layout: Two level (basement c laundry)     Bathroom Shower/Tub: Tub/shower unit         Home Equipment: None      Prior Functioning/Environment Level of Independence: Independent            OT Problem List: Decreased activity tolerance      OT Treatment/Interventions:      OT Goals(Current goals can be found in the care plan section) Acute Rehab OT Goals Patient Stated Goal: To breathe better and return to [facility] OT  Goal Formulation: With patient Time For Goal Achievement: 11/17/19 Potential to Achieve Goals: Good  OT Frequency:     Barriers to D/C: Inaccessible home environment           AM-PAC OT "6 Clicks" Daily Activity     Outcome Measure Help from another person eating meals?: None Help from another person taking care of personal grooming?: None Help from another person toileting, which includes using toliet, bedpan, or urinal?: None Help from another person bathing (including washing, rinsing, drying)?: A Little Help from another person to put on and taking off regular upper body clothing?: None Help from another person to put on and taking off regular lower body clothing?: A Little 6 Click Score: 22   End of Session Equipment Utilized During Treatment: Oxygen (5L [place]) Nurse Communication: Mobility status  Activity Tolerance: Patient tolerated treatment well Patient left: in bed;with call bell/phone within reach;with nursing/sitter in room  OT Visit Diagnosis: Other abnormalities of gait and mobility (R26.89)                Time: 1224-8250 OT Time Calculation (min): 17 min Charges:  OT General Charges $OT Visit: 1 Visit OT Evaluation $OT Eval Low Complexity: 1 Low OT Treatments $Self Care/Home Management : 8-22 mins  Kathie Dike, M.S. OTR/L  11/03/19, 4:37 PM  ascom [phone]

## 2019-11-03 NOTE — Progress Notes (Signed)
PROGRESS NOTE    Alan Riles  TDD:[id number] DOB: [birth year] DOA: 11/01/2019 PCP: Patient, No Pcp Per    Brief Narrative:  HPI: Yolanda Nelson is a 59 y.o. female with medical history significant of hypertension who presents with dyspnea.  Patient not feeling well since August 18, positive aches and pains, malaise and generalized weakness.  4 days later on August 22 she tested positive for COVID-19.  For the last 5 days her symptoms continue to deteriorate, with decreased p.o. intake, fevers, and chills.  Positive decreased energy, then she has been mostly laying in bed.   For the last 48 hours she has been experiencing dyspnea, moderate to severe in intensity, associated with dry cough, no improving or worsening factors.  Today she went to urgent care, she was found hypoxic 88% on room air on ambulation.  A chest radiograph was performed revealing bilateral interstitial infiltrates.  Patient was placed on 4 L of supplemental oxygen per nasal cannula and transported to the emergency department.  8/28: Patient seen and examined.  Remained stable.  On 3 to 4 L nasal cannula.  Endorsing some shortness of breath improving over interval.  8/29: Saturation slightly worse today.  On 5 L nasal cannula.  Having some issues with anxiety.  Appeared fatigued on my evaluation today.   Assessment & Plan:   Principal Problem:   Acute respiratory failure with hypoxia (HCC) Active Problems:   Pneumonia due to COVID-19 virus   Essential hypertension   Seasonal allergies  Acute hypoxic respiratory failure secondary to COVID-19 pneumonia Presented with evidence of pneumonia Now on 5 L nasal cannula Plan: Continue remdesivir, 11/01/2019- Continue steroids, 11/01/2019- Continue baricitinib, 11/01/2019- Supplemental oxygen, wean as tolerated Bronchodilator therapy via MDI Stress I-S and flutter valve use Prone as tolerated Follow Covid inflammatory markers Lasix 40 mg IV x1 today.  Reassess daily for need  for diuretic   Hypertension Blood pressure well controlled over interval Home antihypertensive agents held for now  Allergic rhinitis Continue montelukast and loratadine   DVT prophylaxis: Lovenox Code Status: Full Family Communication: None today Disposition Plan: Status is: Inpatient  Remains inpatient appropriate because:Inpatient level of care appropriate due to severity of illness   Dispo: The patient is from: Home              Anticipated d/c is to: Home              Anticipated d/c date is: 2 days              Patient currently is not medically stable to d/c.   5 L nasal cannula.  Will attempt diuresis.  Disposition plan pending weaning from a submental oxygen.   Consultants:   None  Procedures:   None  Antimicrobials:   Remdesivir   Subjective: Seen and examined.  Endorses some cough and shortness of breath.  Improving over interval.  Objective: Vitals:   11/02/19 2020 11/03/19 0103 11/03/19 0503 11/03/19 0819  BP: 119/86 109/64 125/73 129/74  Pulse: 72 64 64 69  Resp:  16  17  Temp: 97.8 F (36.6 C) 97.7 F (36.5 C) (!) 97.4 F (36.3 C) 97.6 F (36.4 C)  TempSrc: Oral Oral Oral Oral  SpO2: (!) 84% (!) 89% (!) 85% 91%  Weight:      Height:        Intake/Output Summary (Last 24 hours) at 11/03/2019 1257 Last data filed at 11/02/2019 2300 Gross per 24 hour  Intake 106.2 ml  Output --  Net 106.2 ml   Filed Weights   11/01/19 1438  Weight: 86.2 kg    Examination:  General: Appears fatigued HEENT: Normocephalic, atraumatic Neck, supple, trachea midline, no tenderness Heart: Regular rate and rhythm, S1/S2 normal, no murmurs Lungs: Lung sounds decreased at bases.  Scattered crackles.  5 L nasal cannula. Abdomen: Soft, nontender, nondistended, positive bowel sounds Extremities: Normal, atraumatic, no clubbing or cyanosis, normal muscle tone Skin: No rashes or lesions, normal color Neurologic: Cranial nerves grossly intact, sensation  intact, alert and oriented x3 Psychiatric: Appears anxious     Data Reviewed: I have personally reviewed following labs and imaging studies  CBC: Recent Labs  Lab 11/01/19 1447 11/03/19 0346  WBC 6.9 11.1*  NEUTROABS 5.8 8.7*  HGB 11.9* 11.9*  HCT 36.2 35.9*  MCV 90.3 89.1  PLT 227 333   Basic Metabolic Panel: Recent Labs  Lab 11/01/19 1447 11/03/19 0346  NA 136 135  K 4.1 4.0  CL 98 97*  CO2 28 27  GLUCOSE 106* 133*  BUN 12 24*  CREATININE 0.76 0.74  CALCIUM 8.7* 9.2   GFR: Estimated Creatinine Clearance: 75.5 mL/min (by C-G formula based on SCr of 0.74 mg/dL). Liver Function Tests: Recent Labs  Lab 11/01/19 1447 11/03/19 0346  AST 46* 39  ALT 21 28  ALKPHOS 40 34*  BILITOT 0.6 0.6  PROT 7.5 7.2  ALBUMIN 3.6 3.4*   No results for input(s): LIPASE, AMYLASE in the last 168 hours. No results for input(s): AMMONIA in the last 168 hours. Coagulation Profile: No results for input(s): INR, PROTIME in the last 168 hours. Cardiac Enzymes: No results for input(s): CKTOTAL, CKMB, CKMBINDEX, TROPONINI in the last 168 hours. BNP (last 3 results) No results for input(s): PROBNP in the last 8760 hours. HbA1C: No results for input(s): HGBA1C in the last 72 hours. CBG: No results for input(s): GLUCAP in the last 168 hours. Lipid Profile: No results for input(s): CHOL, HDL, LDLCALC, TRIG, CHOLHDL, LDLDIRECT in the last 72 hours. Thyroid Function Tests: No results for input(s): TSH, T4TOTAL, FREET4, T3FREE, THYROIDAB in the last 72 hours. Anemia Panel: Recent Labs    11/02/19 0351 11/03/19 0346  FERRITIN 384* 434*   Sepsis Labs: Recent Labs  Lab 11/01/19 1448  PROCALCITON <0.10  LATICACIDVEN 0.8    Recent Results (from the past 240 hour(s))  SARS Coronavirus 2 by RT PCR (hospital order, performed in [facility] hospital lab) Nasopharyngeal Nasopharyngeal Swab     Status: Abnormal   Collection Time: 11/01/19  3:05 PM   Specimen: Nasopharyngeal Swab    Result Value Ref Range Status   SARS Coronavirus 2 POSITIVE (A) NEGATIVE Final    Comment: RESULT CALLED TO, READ BACK BY AND VERIFIED WITH: CHRIS BENNETT AT 1753 ON 11/01/19 RWW (NOTE) SARS-CoV-2 target nucleic acids are DETECTED  SARS-CoV-2 RNA is generally detectable in upper respiratory specimens  during the acute phase of infection.  Positive results are indicative  of the presence of the identified virus, but do not rule out bacterial infection or co-infection with other pathogens not detected by the test.  Clinical correlation with patient history and  other diagnostic information is necessary to determine patient infection status.  The expected result is negative.  Fact Sheet for Patients:   BoilerBrush.com.cy   Fact Sheet for Healthcare Providers:   https://pope.com/    This test is not yet approved or cleared by the [place] FDA and  has been authorized for detection and/or diagnosis of SARS-CoV-2 by  FDA under an Emergency Use Authorization (EUA).  This EUA will remain in effect (meaning this  test can be used) for the duration of  the COVID-19 declaration under Section 564(b)(1) of the Act, 21 U.S.C. section 360-bbb-3(b)(1), unless the authorization is terminated or revoked sooner.  Performed at [facility], [address], [place], [place] [address]          Radiology Studies: No results found.      Scheduled Meds: . albuterol  1 puff Inhalation Q4H  . vitamin C  500 mg Oral Daily  . baricitinib  4 mg Oral Daily  . chlorpheniramine-HYDROcodone  5 mL Oral Q12H  . enoxaparin (LOVENOX) injection  40 mg Subcutaneous Q24H  . feeding supplement  1 Container Oral TID BM  . Ipratropium-Albuterol  1 puff Inhalation Q6H  . loratadine  10 mg Oral Daily  . methylPREDNISolone (SOLU-MEDROL) injection  40 mg Intravenous Q12H  . montelukast  10 mg Oral QHS  . multivitamin with minerals  1 tablet Oral  Daily  . zinc sulfate  220 mg Oral Daily   Continuous Infusions: . remdesivir 100 mg in NS 100 mL 100 mg (11/03/19 1019)     LOS: 2 days    Time spent: 25 minutes    Tresa Moore, MD [facility] Pager [phone]  If 7PM-7AM, please contact night-coverage 11/03/2019, 12:57 PM

## 2019-11-04 LAB — CBC WITH DIFFERENTIAL/PLATELET
Abs Immature Granulocytes: 0.35 10*3/uL — ABNORMAL HIGH (ref 0.00–0.07)
Basophils Absolute: 0.1 10*3/uL (ref 0.0–0.1)
Basophils Relative: 0 %
Eosinophils Absolute: 0 10*3/uL (ref 0.0–0.5)
Eosinophils Relative: 0 %
HCT: 35.6 % — ABNORMAL LOW (ref 36.0–46.0)
Hemoglobin: 11.9 g/dL — ABNORMAL LOW (ref 12.0–15.0)
Immature Granulocytes: 3 %
Lymphocytes Relative: 9 %
Lymphs Abs: 1 10*3/uL (ref 0.7–4.0)
MCH: 29.9 pg (ref 26.0–34.0)
MCHC: 33.4 g/dL (ref 30.0–36.0)
MCV: 89.4 fL (ref 80.0–100.0)
Monocytes Absolute: 0.4 10*3/uL (ref 0.1–1.0)
Monocytes Relative: 4 %
Neutro Abs: 9.5 10*3/uL — ABNORMAL HIGH (ref 1.7–7.7)
Neutrophils Relative %: 84 %
Platelets: 377 10*3/uL (ref 150–400)
RBC: 3.98 MIL/uL (ref 3.87–5.11)
RDW: 12.5 % (ref 11.5–15.5)
WBC: 11.2 10*3/uL — ABNORMAL HIGH (ref 4.0–10.5)
nRBC: 0 % (ref 0.0–0.2)

## 2019-11-04 LAB — FERRITIN: Ferritin: 420 ng/mL — ABNORMAL HIGH (ref 11–307)

## 2019-11-04 LAB — COMPREHENSIVE METABOLIC PANEL
ALT: 48 U/L — ABNORMAL HIGH (ref 0–44)
AST: 57 U/L — ABNORMAL HIGH (ref 15–41)
Albumin: 3.2 g/dL — ABNORMAL LOW (ref 3.5–5.0)
Alkaline Phosphatase: 34 U/L — ABNORMAL LOW (ref 38–126)
Anion gap: 9 (ref 5–15)
BUN: 24 mg/dL — ABNORMAL HIGH (ref 6–20)
CO2: 27 mmol/L (ref 22–32)
Calcium: 8.7 mg/dL — ABNORMAL LOW (ref 8.9–10.3)
Chloride: 103 mmol/L (ref 98–111)
Creatinine, Ser: 0.82 mg/dL (ref 0.44–1.00)
GFR calc Af Amer: >60 mL/min (ref >60)
GFR calc non Af Amer: >60 mL/min (ref >60)
Glucose, Bld: 184 mg/dL — ABNORMAL HIGH (ref 70–99)
Potassium: 3.7 mmol/L (ref 3.5–5.1)
Sodium: 139 mmol/L (ref 135–145)
Total Bilirubin: 0.5 mg/dL (ref 0.3–1.2)
Total Protein: 6.8 g/dL (ref 6.5–8.1)

## 2019-11-04 LAB — C-REACTIVE PROTEIN: CRP: 0.8 mg/dL (ref <1.0)

## 2019-11-04 LAB — FIBRIN DERIVATIVES D-DIMER (ARMC ONLY): Fibrin derivatives D-dimer (ARMC): 831.71 ng/mL (FEU) — ABNORMAL HIGH (ref 0.00–499.00)

## 2019-11-04 MED ORDER — FUROSEMIDE 10 MG/ML IJ SOLN
40.0000 mg | Freq: Once | INTRAMUSCULAR | Status: AC
  Administered 2019-11-04: 10:00:00 40 mg via INTRAVENOUS
  Filled 2019-11-04: qty 4

## 2019-11-04 NOTE — Progress Notes (Signed)
Patient ambulated to bathroom on RA, saturations noted to drop to 80% with quick recovery.

## 2019-11-04 NOTE — Progress Notes (Signed)
PROGRESS NOTE    Yolanda Nelson  JKD:[id number] DOB: [birth year] DOA: 11/01/2019 PCP: Patient, No Pcp Per    Brief Narrative:  HPI: Yolanda Nelson is a 59 y.o. female with medical history significant of hypertension who presents with dyspnea.  Patient not feeling well since August 18, positive aches and pains, malaise and generalized weakness.  4 days later on August 22 she tested positive for COVID-19.  For the last 5 days her symptoms continue to deteriorate, with decreased p.o. intake, fevers, and chills.  Positive decreased energy, then she has been mostly laying in bed.   For the last 48 hours she has been experiencing dyspnea, moderate to severe in intensity, associated with dry cough, no improving or worsening factors.  Today she went to urgent care, she was found hypoxic 88% on room air on ambulation.  A chest radiograph was performed revealing bilateral interstitial infiltrates.  Patient was placed on 4 L of supplemental oxygen per nasal cannula and transported to the emergency department.  8/28: Patient seen and examined.  Remained stable.  On 3 to 4 L nasal cannula.  Endorsing some shortness of breath improving over interval.  8/29: Saturation slightly worse today.  On 5 L nasal cannula.  Having some issues with anxiety.  Appeared fatigued on my evaluation today.  8/30: Seen and examined.  6 L high flow nasal cannula.    Assessment & Plan:   Principal Problem:   Acute respiratory failure with hypoxia (HCC) Active Problems:   Pneumonia due to COVID-19 virus   Essential hypertension   Seasonal allergies  Acute hypoxic respiratory failure secondary to COVID-19 pneumonia Presented with evidence of pneumonia Now on 6 L high flow nasal cannula Plan: Continue remdesivir, 11/01/2019-11/05/2019 Continue steroids, 11/01/2019- Continue baricitinib, 11/01/2019- Supplemental oxygen, wean as tolerated Bronchodilator therapy via MDI Stress I-S and flutter valve use Prone as tolerated Follow  Covid inflammatory markers Repeat Lasix, 40 mg IV x1 today.  Reassess daily for diuretic need   Hypertension Blood pressure well controlled over interval Home antihypertensive agents held for now  Allergic rhinitis Continue montelukast and loratadine   DVT prophylaxis: Lovenox Code Status: Full Family Communication: None today Disposition Plan: Status is: Inpatient  Remains inpatient appropriate because:Inpatient level of care appropriate due to severity of illness   Dispo: The patient is from: Home              Anticipated d/c is to: Home              Anticipated d/c date is: 3 days              Patient currently is not medically stable to d/c.   On 6 L nasal cannula.  Stressed I-S use and prone positioning.  Attempting Lasix diuresis today.  Anticipate 3 additional days of inpatient monitoring and treatment prior to disposition.   Consultants:   None  Procedures:   None  Antimicrobials:   Remdesivir   Subjective: Seen and examined.  Endorses fatigue.  Cough and shortness of breath improving..  Objective: Vitals:   11/03/19 1759 11/03/19 2157 11/04/19 0748 11/04/19 1105  BP:  136/77 132/75 129/89  Pulse: 70 63 68 66  Resp: 20 16 18  (!) 22  Temp:  97.8 F (36.6 C) 97.9 F (36.6 C) 98 F (36.7 C)  TempSrc:  Oral Oral Oral  SpO2: 92% 94% 93% 92%  Weight:      Height:       No intake or output data in  the 24 hours ending 11/04/19 1301 Filed Weights   11/01/19 1438  Weight: 86.2 kg    Examination:  General: Appears fatigued HEENT: Normocephalic, atraumatic Neck, supple, trachea midline, no tenderness Heart: Regular rate and rhythm, S1/S2 normal, no murmurs Lungs: Bibasilar crackles.  No wheeze.  Normal work of breathing.  6 L Abdomen: Soft, nontender, nondistended, positive bowel sounds Extremities: Normal, atraumatic, no clubbing or cyanosis, normal muscle tone Skin: No rashes or lesions, normal color Neurologic: Cranial nerves grossly intact,  sensation intact, alert and oriented x3 Psychiatric: Anxiety improving      Data Reviewed: I have personally reviewed following labs and imaging studies  CBC: Recent Labs  Lab 11/01/19 1447 11/03/19 0346 11/04/19 0627  WBC 6.9 11.1* 11.2*  NEUTROABS 5.8 8.7* 9.5*  HGB 11.9* 11.9* 11.9*  HCT 36.2 35.9* 35.6*  MCV 90.3 89.1 89.4  PLT 227 333 377   Basic Metabolic Panel: Recent Labs  Lab 11/01/19 1447 11/03/19 0346 11/04/19 0627  NA 136 135 139  K 4.1 4.0 3.7  CL 98 97* 103  CO2 28 27 27   GLUCOSE 106* 133* 184*  BUN 12 24* 24*  CREATININE 0.76 0.74 0.82  CALCIUM 8.7* 9.2 8.7*   GFR: Estimated Creatinine Clearance: 73.7 mL/min (by C-G formula based on SCr of 0.82 mg/dL). Liver Function Tests: Recent Labs  Lab 11/01/19 1447 11/03/19 0346 11/04/19 0627  AST 46* 39 57*  ALT 21 28 48*  ALKPHOS 40 34* 34*  BILITOT 0.6 0.6 0.5  PROT 7.5 7.2 6.8  ALBUMIN 3.6 3.4* 3.2*   No results for input(s): LIPASE, AMYLASE in the last 168 hours. No results for input(s): AMMONIA in the last 168 hours. Coagulation Profile: No results for input(s): INR, PROTIME in the last 168 hours. Cardiac Enzymes: No results for input(s): CKTOTAL, CKMB, CKMBINDEX, TROPONINI in the last 168 hours. BNP (last 3 results) No results for input(s): PROBNP in the last 8760 hours. HbA1C: No results for input(s): HGBA1C in the last 72 hours. CBG: No results for input(s): GLUCAP in the last 168 hours. Lipid Profile: No results for input(s): CHOL, HDL, LDLCALC, TRIG, CHOLHDL, LDLDIRECT in the last 72 hours. Thyroid Function Tests: No results for input(s): TSH, T4TOTAL, FREET4, T3FREE, THYROIDAB in the last 72 hours. Anemia Panel: Recent Labs    11/03/19 0346 11/04/19 0627  FERRITIN 434* 420*   Sepsis Labs: Recent Labs  Lab 11/01/19 1448  PROCALCITON <0.10  LATICACIDVEN 0.8    Recent Results (from the past 240 hour(s))  SARS Coronavirus 2 by RT PCR (hospital order, performed in [facility] hospital lab) Nasopharyngeal Nasopharyngeal Swab     Status: Abnormal   Collection Time: 11/01/19  3:05 PM   Specimen: Nasopharyngeal Swab  Result Value Ref Range Status   SARS Coronavirus 2 POSITIVE (A) NEGATIVE Final    Comment: RESULT CALLED TO, READ BACK BY AND VERIFIED WITH: CHRIS BENNETT AT 1753 ON 11/01/19 RWW (NOTE) SARS-CoV-2 target nucleic acids are DETECTED  SARS-CoV-2 RNA is generally detectable in upper respiratory specimens  during the acute phase of infection.  Positive results are indicative  of the presence of the identified virus, but do not rule out bacterial infection or co-infection with other pathogens not detected by the test.  Clinical correlation with patient history and  other diagnostic information is necessary to determine patient infection status.  The expected result is negative.  Fact Sheet for Patients:   11/03/19   Fact Sheet for Healthcare Providers:   BoilerBrush.com.cy  This test is not yet approved or cleared by the [place] and  has been authorized for detection and/or diagnosis of SARS-CoV-2 by FDA under an Emergency Use Authorization (EUA).  This EUA will remain in effect (meaning this  test can be used) for the duration of  the COVID-19 declaration under Section 564(b)(1) of the Act, 21 U.S.C. section 360-bbb-3(b)(1), unless the authorization is terminated or revoked sooner.  Performed at [facility], [address] [place], [place] [address]          Radiology Studies: No results found.      Scheduled Meds:  albuterol  1 puff Inhalation Q4H   vitamin C  500 mg Oral Daily   baricitinib  4 mg Oral Daily   chlorpheniramine-HYDROcodone  5 mL Oral Q12H   enoxaparin (LOVENOX) injection  40 mg Subcutaneous Q24H   feeding supplement  1 Container Oral TID BM   Ipratropium-Albuterol  1 puff Inhalation Q6H   loratadine  10 mg Oral Daily     methylPREDNISolone (SOLU-MEDROL) injection  40 mg Intravenous Q12H   montelukast  10 mg Oral QHS   multivitamin with minerals  1 tablet Oral Daily   zinc sulfate  220 mg Oral Daily   Continuous Infusions:  remdesivir 100 mg in NS 100 mL 100 mg (11/04/19 1045)     LOS: 3 days    Time spent: 25 minutes    Tresa Moore, MD [facility] Pager [phone]  If 7PM-7AM, please contact night-coverage 11/04/2019, 1:01 PM

## 2019-11-05 LAB — COMPREHENSIVE METABOLIC PANEL
ALT: 54 U/L — ABNORMAL HIGH (ref 0–44)
AST: 41 U/L (ref 15–41)
Albumin: 3.1 g/dL — ABNORMAL LOW (ref 3.5–5.0)
Alkaline Phosphatase: 34 U/L — ABNORMAL LOW (ref 38–126)
Anion gap: 7 (ref 5–15)
BUN: 28 mg/dL — ABNORMAL HIGH (ref 6–20)
CO2: 27 mmol/L (ref 22–32)
Calcium: 8.6 mg/dL — ABNORMAL LOW (ref 8.9–10.3)
Chloride: 103 mmol/L (ref 98–111)
Creatinine, Ser: 0.8 mg/dL (ref 0.44–1.00)
GFR calc Af Amer: >60 mL/min (ref >60)
GFR calc non Af Amer: >60 mL/min (ref >60)
Glucose, Bld: 131 mg/dL — ABNORMAL HIGH (ref 70–99)
Potassium: 4.3 mmol/L (ref 3.5–5.1)
Sodium: 137 mmol/L (ref 135–145)
Total Bilirubin: 0.7 mg/dL (ref 0.3–1.2)
Total Protein: 6.5 g/dL (ref 6.5–8.1)

## 2019-11-05 LAB — CBC WITH DIFFERENTIAL/PLATELET
Abs Immature Granulocytes: 0.78 10*3/uL — ABNORMAL HIGH (ref 0.00–0.07)
Basophils Absolute: 0.1 10*3/uL (ref 0.0–0.1)
Basophils Relative: 1 %
Eosinophils Absolute: 0 10*3/uL (ref 0.0–0.5)
Eosinophils Relative: 0 %
HCT: 35.2 % — ABNORMAL LOW (ref 36.0–46.0)
Hemoglobin: 11.9 g/dL — ABNORMAL LOW (ref 12.0–15.0)
Immature Granulocytes: 7 %
Lymphocytes Relative: 11 %
Lymphs Abs: 1.1 10*3/uL (ref 0.7–4.0)
MCH: 30.4 pg (ref 26.0–34.0)
MCHC: 33.8 g/dL (ref 30.0–36.0)
MCV: 89.8 fL (ref 80.0–100.0)
Monocytes Absolute: 0.7 10*3/uL (ref 0.1–1.0)
Monocytes Relative: 7 %
Neutro Abs: 8.1 10*3/uL — ABNORMAL HIGH (ref 1.7–7.7)
Neutrophils Relative %: 74 %
Platelets: 402 10*3/uL — ABNORMAL HIGH (ref 150–400)
RBC: 3.92 MIL/uL (ref 3.87–5.11)
RDW: 12.5 % (ref 11.5–15.5)
Smear Review: NORMAL
WBC: 10.8 10*3/uL — ABNORMAL HIGH (ref 4.0–10.5)
nRBC: 0 % (ref 0.0–0.2)

## 2019-11-05 LAB — FIBRIN DERIVATIVES D-DIMER (ARMC ONLY): Fibrin derivatives D-dimer (ARMC): 761.08 ng/mL (FEU) — ABNORMAL HIGH (ref 0.00–499.00)

## 2019-11-05 LAB — FERRITIN: Ferritin: 312 ng/mL — ABNORMAL HIGH (ref 11–307)

## 2019-11-05 LAB — C-REACTIVE PROTEIN: CRP: 0.6 mg/dL (ref <1.0)

## 2019-11-05 MED ORDER — LISINOPRIL 5 MG PO TABS
5.0000 mg | ORAL_TABLET | Freq: Every day | ORAL | Status: DC
  Administered 2019-11-05 – 2019-11-07 (×3): 5 mg via ORAL
  Filled 2019-11-05 (×3): qty 1

## 2019-11-05 MED ORDER — FUROSEMIDE 10 MG/ML IJ SOLN
40.0000 mg | Freq: Once | INTRAMUSCULAR | Status: AC
  Administered 2019-11-05: 40 mg via INTRAVENOUS
  Filled 2019-11-05: qty 4

## 2019-11-05 NOTE — Progress Notes (Signed)
PROGRESS NOTE    Yolanda Nelson  KDT:[id number] DOB: 04-30-1960 DOA: 11/01/2019 PCP: Patient, No Pcp Per    Brief Narrative:  HPI: Yolanda Nelson is a 58 y.o. female with medical history significant of hypertension who presents with dyspnea.  Patient not feeling well since August 18, positive aches and pains, malaise and generalized weakness.  4 days later on August 22 she tested positive for COVID-19.  For the last 5 days her symptoms continue to deteriorate, with decreased p.o. intake, fevers, and chills.  Positive decreased energy, then she has been mostly laying in bed.   For the last 48 hours she has been experiencing dyspnea, moderate to severe in intensity, associated with dry cough, no improving or worsening factors.  Today she went to urgent care, she was found hypoxic 88% on room air on ambulation.  A chest radiograph was performed revealing bilateral interstitial infiltrates.  Patient was placed on 4 L of supplemental oxygen per nasal cannula and transported to the emergency department.  8/28: Patient seen and examined.  Remained stable.  On 3 to 4 L nasal cannula.  Endorsing some shortness of breath improving over interval.  8/29: Saturation slightly worse today.  On 5 L nasal cannula.  Having some issues with anxiety.  Appeared fatigued on my evaluation today.  8/30: Seen and examined.  6 L high flow nasal cannula.    8/31: Seen and examined.  Remains on 6 L high flow nasal cannula.  Appears fatigued.  Assessment & Plan:   Principal Problem:   Acute respiratory failure with hypoxia (HCC) Active Problems:   Pneumonia due to COVID-19 virus   Essential hypertension   Seasonal allergies  Acute hypoxic respiratory failure secondary to COVID-19 pneumonia Presented with evidence of pneumonia Remains on 6 L high flow nasal cannula Plan: Completed remdesivir today 11/01/2019-11/05/2019 Continue steroids, 11/01/2019- Continue baricitinib, 11/01/2019- Supplemental oxygen, wean as  tolerated Bronchodilator therapy via MDI Stress I-S and flutter valve use Prone as tolerated Follow Covid inflammatory markers Repeat Lasix, 40 mg IV x1 today.  Reassess daily for diuretic need   Hypertension Resume home lisinopril.  Had been held earlier  Allergic rhinitis Continue montelukast and loratadine   DVT prophylaxis: Lovenox Code Status: Full Family Communication: None today Disposition Plan: Status is: Inpatient  Remains inpatient appropriate because:Inpatient level of care appropriate due to severity of illness   Dispo: The patient is from: Home              Anticipated d/c is to: Home              Anticipated d/c date is: 2 days              Patient currently is not medically stable to d/c.   Still hypoxic requiring 6 L high flow nasal cannula.  Unable to wean from this rate.  Continue stressing incentive spirometry use.  Prone as tolerated.  As needed Lasix.  Continue steroids.  Anticipate 48 additional hours of inpatient monitoring and treatment prior to disposition planning.   Consultants:   None  Procedures:   None  Antimicrobials:   Remdesivir   Subjective: Seen and examined.  Continues to endorse fatigue.  Objective: Vitals:   11/05/19 0025 11/05/19 0525 11/05/19 0825 11/05/19 1151  BP: 138/88 (!) 144/99 107/82 (!) 143/80  Pulse: (!) 55 (!) 50 63 65  Resp: 17 17 18 20   Temp: 98.1 F (36.7 C) 98 F (36.7 C) 97.7 F (36.5 C) 98.2 F (36.8 C)  TempSrc:   Oral Oral  SpO2: 94% 98% 91% 91%  Weight:      Height:       No intake or output data in the 24 hours ending 11/05/19 1255 Filed Weights   11/01/19 1438  Weight: 86.2 kg    Examination:  General: Appears fatigued HEENT: Normocephalic, atraumatic Neck, supple, trachea midline, no tenderness Heart: Regular rate and rhythm, S1/S2 normal, no murmurs Lungs: Bibasilar crackles.  Poor respiratory effort.  6 L nasal cannula Abdomen: Soft, nontender, nondistended, positive bowel  sounds Extremities: Normal, atraumatic, no clubbing or cyanosis, normal muscle tone Skin: No rashes or lesions, normal color Neurologic: Cranial nerves grossly intact, sensation intact, alert and oriented x3 Psychiatric: Normal affect       Data Reviewed: I have personally reviewed following labs and imaging studies  CBC: Recent Labs  Lab 11/01/19 1447 11/03/19 0346 11/04/19 0627 11/05/19 0551  WBC 6.9 11.1* 11.2* 10.8*  NEUTROABS 5.8 8.7* 9.5* 8.1*  HGB 11.9* 11.9* 11.9* 11.9*  HCT 36.2 35.9* 35.6* 35.2*  MCV 90.3 89.1 89.4 89.8  PLT 227 333 377 402*   Basic Metabolic Panel: Recent Labs  Lab 11/01/19 1447 11/03/19 0346 11/04/19 0627 11/05/19 0551  NA 136 135 139 137  K 4.1 4.0 3.7 4.3  CL 98 97* 103 103  CO2 28 27 27 27   GLUCOSE 106* 133* 184* 131*  BUN 12 24* 24* 28*  CREATININE 0.76 0.74 0.82 0.80  CALCIUM 8.7* 9.2 8.7* 8.6*   GFR: Estimated Creatinine Clearance: 75.5 mL/min (by C-G formula based on SCr of 0.8 mg/dL). Liver Function Tests: Recent Labs  Lab 11/01/19 1447 11/03/19 0346 11/04/19 0627 11/05/19 0551  AST 46* 39 57* 41  ALT 21 28 48* 54*  ALKPHOS 40 34* 34* 34*  BILITOT 0.6 0.6 0.5 0.7  PROT 7.5 7.2 6.8 6.5  ALBUMIN 3.6 3.4* 3.2* 3.1*   No results for input(s): LIPASE, AMYLASE in the last 168 hours. No results for input(s): AMMONIA in the last 168 hours. Coagulation Profile: No results for input(s): INR, PROTIME in the last 168 hours. Cardiac Enzymes: No results for input(s): CKTOTAL, CKMB, CKMBINDEX, TROPONINI in the last 168 hours. BNP (last 3 results) No results for input(s): PROBNP in the last 8760 hours. HbA1C: No results for input(s): HGBA1C in the last 72 hours. CBG: No results for input(s): GLUCAP in the last 168 hours. Lipid Profile: No results for input(s): CHOL, HDL, LDLCALC, TRIG, CHOLHDL, LDLDIRECT in the last 72 hours. Thyroid Function Tests: No results for input(s): TSH, T4TOTAL, FREET4, T3FREE, THYROIDAB in the  last 72 hours. Anemia Panel: Recent Labs    11/04/19 0627 11/05/19 0551  FERRITIN 420* 312*   Sepsis Labs: Recent Labs  Lab 11/01/19 1448  PROCALCITON <0.10  LATICACIDVEN 0.8    Recent Results (from the past 240 hour(s))  SARS Coronavirus 2 by RT PCR (hospital order, performed in [facility] hospital lab) Nasopharyngeal Nasopharyngeal Swab     Status: Abnormal   Collection Time: 11/01/19  3:05 PM   Specimen: Nasopharyngeal Swab  Result Value Ref Range Status   SARS Coronavirus 2 POSITIVE (A) NEGATIVE Final    Comment: RESULT CALLED TO, READ BACK BY AND VERIFIED WITH: CHRIS BENNETT AT 1753 ON 11/01/19 RWW (NOTE) SARS-CoV-2 target nucleic acids are DETECTED  SARS-CoV-2 RNA is generally detectable in upper respiratory specimens  during the acute phase of infection.  Positive results are indicative  of the presence of the identified virus, but do not rule  out bacterial infection or co-infection with other pathogens not detected by the test.  Clinical correlation with patient history and  other diagnostic information is necessary to determine patient infection status.  The expected result is negative.  Fact Sheet for Patients:   BoilerBrush.com.cy   Fact Sheet for Healthcare Providers:   https://pope.com/    This test is not yet approved or cleared by the [place] FDA and  has been authorized for detection and/or diagnosis of SARS-CoV-2 by FDA under an Emergency Use Authorization (EUA).  This EUA will remain in effect (meaning this  test can be used) for the duration of  the COVID-19 declaration under Section 564(b)(1) of the Act, 21 U.S.C. section 360-bbb-3(b)(1), unless the authorization is terminated or revoked sooner.  Performed at [facility], [address] [place], [place] [address]          Radiology Studies: No results found.      Scheduled Meds: . albuterol  1 puff Inhalation Q4H  .  vitamin C  500 mg Oral Daily  . baricitinib  4 mg Oral Daily  . chlorpheniramine-HYDROcodone  5 mL Oral Q12H  . enoxaparin (LOVENOX) injection  40 mg Subcutaneous Q24H  . feeding supplement  1 Container Oral TID BM  . Ipratropium-Albuterol  1 puff Inhalation Q6H  . loratadine  10 mg Oral Daily  . methylPREDNISolone (SOLU-MEDROL) injection  40 mg Intravenous Q12H  . montelukast  10 mg Oral QHS  . multivitamin with minerals  1 tablet Oral Daily  . zinc sulfate  220 mg Oral Daily   Continuous Infusions:    LOS: 4 days    Time spent: 25 minutes    Tresa Moore, MD [facility] Pager [phone]  If 7PM-7AM, please contact night-coverage 11/05/2019, 12:55 PM

## 2019-11-06 DIAGNOSIS — J9621 Acute and chronic respiratory failure with hypoxia: Secondary | ICD-10-CM

## 2019-11-06 DIAGNOSIS — R0602 Shortness of breath: Secondary | ICD-10-CM

## 2019-11-06 LAB — CBC WITH DIFFERENTIAL/PLATELET
Abs Immature Granulocytes: 1.3 10*3/uL — ABNORMAL HIGH (ref 0.00–0.07)
Basophils Absolute: 0.1 10*3/uL (ref 0.0–0.1)
Basophils Relative: 1 %
Eosinophils Absolute: 0 10*3/uL (ref 0.0–0.5)
Eosinophils Relative: 0 %
HCT: 39 % (ref 36.0–46.0)
Hemoglobin: 13.1 g/dL (ref 12.0–15.0)
Immature Granulocytes: 9 %
Lymphocytes Relative: 11 %
Lymphs Abs: 1.6 10*3/uL (ref 0.7–4.0)
MCH: 30 pg (ref 26.0–34.0)
MCHC: 33.6 g/dL (ref 30.0–36.0)
MCV: 89.2 fL (ref 80.0–100.0)
Monocytes Absolute: 1 10*3/uL (ref 0.1–1.0)
Monocytes Relative: 7 %
Neutro Abs: 10.3 10*3/uL — ABNORMAL HIGH (ref 1.7–7.7)
Neutrophils Relative %: 72 %
Platelets: 504 10*3/uL — ABNORMAL HIGH (ref 150–400)
RBC: 4.37 MIL/uL (ref 3.87–5.11)
RDW: 12.5 % (ref 11.5–15.5)
Smear Review: NORMAL
WBC: 14.3 10*3/uL — ABNORMAL HIGH (ref 4.0–10.5)
nRBC: 0.1 % (ref 0.0–0.2)

## 2019-11-06 LAB — COMPREHENSIVE METABOLIC PANEL
ALT: 51 U/L — ABNORMAL HIGH (ref 0–44)
AST: 28 U/L (ref 15–41)
Albumin: 3.2 g/dL — ABNORMAL LOW (ref 3.5–5.0)
Alkaline Phosphatase: 35 U/L — ABNORMAL LOW (ref 38–126)
Anion gap: 8 (ref 5–15)
BUN: 27 mg/dL — ABNORMAL HIGH (ref 6–20)
CO2: 28 mmol/L (ref 22–32)
Calcium: 9 mg/dL (ref 8.9–10.3)
Chloride: 102 mmol/L (ref 98–111)
Creatinine, Ser: 0.82 mg/dL (ref 0.44–1.00)
GFR calc Af Amer: >60 mL/min (ref >60)
GFR calc non Af Amer: >60 mL/min (ref >60)
Glucose, Bld: 131 mg/dL — ABNORMAL HIGH (ref 70–99)
Potassium: 4.6 mmol/L (ref 3.5–5.1)
Sodium: 138 mmol/L (ref 135–145)
Total Bilirubin: 0.8 mg/dL (ref 0.3–1.2)
Total Protein: 7 g/dL (ref 6.5–8.1)

## 2019-11-06 LAB — FERRITIN
Ferritin: 319 ng/mL — ABNORMAL HIGH (ref 11–307)
Ferritin: 296 ng/mL (ref 11–307)

## 2019-11-06 LAB — PHOSPHORUS: Phosphorus: 2.9 mg/dL (ref 2.5–4.6)

## 2019-11-06 LAB — FIBRIN DERIVATIVES D-DIMER (ARMC ONLY)
Fibrin derivatives D-dimer (ARMC): 661.82 ng/mL (FEU) — ABNORMAL HIGH (ref 0.00–499.00)
Fibrin derivatives D-dimer (ARMC): 626.18 ng/mL (FEU) — ABNORMAL HIGH (ref 0.00–499.00)

## 2019-11-06 LAB — C-REACTIVE PROTEIN
CRP: <0.5 mg/dL (ref <1.0)
CRP: 0.7 mg/dL (ref <1.0)

## 2019-11-06 LAB — MAGNESIUM: Magnesium: 2.5 mg/dL — ABNORMAL HIGH (ref 1.7–2.4)

## 2019-11-06 LAB — LACTATE DEHYDROGENASE: LDH: 222 U/L — ABNORMAL HIGH (ref 98–192)

## 2019-11-06 NOTE — Progress Notes (Signed)
PROGRESS NOTE    Yolanda Nelson  UXN:[id number] DOB: 11-14-1960 DOA: 11/01/2019 PCP: Patient, No Pcp Per     Brief Narrative:  59 y.o. WF PMHx HTN,   Presents with dyspnea. Patient not feeling well since August 18, positive aches and pains, malaise and generalized weakness. 4 days later on August 22 she tested positive for COVID-19. For the last 5 days her symptoms continue to deteriorate, with decreased p.o. intake, fevers, and chills. Positive decreased energy, thenshe has been mostly laying in bed.  For the last 48 hours she hasbeenexperiencingdyspnea, moderate to severe in intensity, associated with dry cough, no improving or worsening factors.  Today she went to urgent care, she was found hypoxic 88% on room air on ambulation. A chest radiograph was performed revealing bilateral interstitial infiltrates. Patient was placed on 4 L of supplemental oxygen per nasal cannula and transported to the emergency department.  8/28: Patient seen and examined.  Remained stable.  On 3 to 4 L nasal cannula.  Endorsing some shortness of breath improving over interval.  8/29: Saturation slightly worse today.  On 5 L nasal cannula.  Having some issues with anxiety.  Appeared fatigued on my evaluation today.  8/30: Seen and examined.  6 L high flow nasal cannula.    8/31: Seen and examined.  Remains on 6 L high flow nasal cannula.  Appears fatigued.   Subjective: A/O x4, positive S OB, negative CP, negative abdominal pain   Assessment & Plan: Covid vaccination; negative vaccination   Principal Problem:   Acute respiratory failure with hypoxia (HCC) Active Problems:   Pneumonia due to COVID-19 virus   Essential hypertension   Seasonal allergies  Acute respiratory failure with hypoxia/Covid 19 pneumonia COVID-19 Labs  Recent Labs    11/04/19 0627 11/05/19 0551 11/06/19 0503  FERRITIN 420* 312* 319*  CRP 0.8 0.6  --     Lab Results  Component Value Date   SARSCOV2NAA  POSITIVE (A) 11/01/2019   SARSCOV2NAA Not Detected 12/20/2018    Acute hypoxic respiratory failure secondary to COVID-19 pneumonia Presented with evidence of pneumonia Remains on 6 L high flow nasal cannula Plan: Completed remdesivir today 11/01/2019-11/05/2019 Continue steroids, 11/01/2019- Continue baricitinib, 11/01/2019- Supplemental oxygen, wean as tolerated Bronchodilator therapy via MDI Stress I-S and flutter valve use Prone as tolerated Follow Covid inflammatory markers Repeat Lasix, 40 mg IV x1 today.  Reassess daily for diuretic need   Hypertension Resume home lisinopril.  Had been held earlier  Allergic rhinitis Continue montelukast and loratadine   DVT prophylaxis: Lovenox Code Status: Full Family Communication:  Status is: Inpatient    Dispo: The patient is from: Home              Anticipated d/c is to: Home              Anticipated d/c date is: 9/3              Patient currently unstable      Consultants:    Procedures/Significant Events:    I have personally reviewed and interpreted all radiology studies and my findings are as above.  VENTILATOR SETTINGS: HFNC 9/1 Flow; 5 L/min SPO2 93%   Cultures   Antimicrobials: Anti-infectives (From admission, onward)   Start     Ordered Stop   11/02/19 1000  remdesivir 100 mg in sodium chloride 0.9 % 100 mL IVPB       "Followed by" Linked Group Details   11/01/19 1732 11/05/19 0850   11/01/19  1900  remdesivir 200 mg in sodium chloride 0.9% 250 mL IVPB       "Followed by" Linked Group Details   11/01/19 1732 11/01/19 2230       Devices    LINES / TUBES:      Continuous Infusions:   Objective: Vitals:   11/05/19 1601 11/05/19 1635 11/05/19 2007 11/06/19 0742  BP: 109/72  131/87 116/67  Pulse: (!) 58  (!) 56 (!) 49  Resp: 16  16 16   Temp: 98.7 F (37.1 C)  97.8 F (36.6 C) 97.9 F (36.6 C)  TempSrc: Oral   Oral  SpO2: 96% 94% 93% 97%  Weight:      Height:         Intake/Output Summary (Last 24 hours) at 11/06/2019 0854 Last data filed at 11/05/2019 1800 Gross per 24 hour  Intake 480 ml  Output --  Net 480 ml   Filed Weights   11/01/19 1438  Weight: 86.2 kg    Examination:  General: A/O x4.positive acute respiratory distress Eyes: negative scleral hemorrhage, negative anisocoria, negative icterus ENT: Negative Runny nose, negative gingival bleeding, Neck:  Negative scars, masses, torticollis, lymphadenopathy, JVD Lungs: decreased breath sounds bilaterally without wheezes or crackles Cardiovascular: Regular rate and rhythm without murmur gallop or rub normal S1 and S2 Abdomen: negative abdominal pain, nondistended, positive soft, bowel sounds, no rebound, no ascites, no appreciable mass Extremities: No significant cyanosis, clubbing, or edema bilateral lower extremities Skin: Negative rashes, lesions, ulcers Psychiatric:  Negative depression, negative anxiety, negative fatigue, negative mania  Central nervous system:  Cranial nerves II through XII intact, tongue/uvula midline, all extremities muscle strength 5/5, sensation intact throughout, negative dysarthria, negative expressive aphasia, negative receptive aphasia.  .     Data Reviewed: Care during the described time interval was provided by me .  I have reviewed this patient's available data, including medical history, events of note, physical examination, and all test results as part of my evaluation.  CBC: Recent Labs  Lab 11/01/19 1447 11/03/19 0346 11/04/19 0627 11/05/19 0551 11/06/19 0503  WBC 6.9 11.1* 11.2* 10.8* 14.3*  NEUTROABS 5.8 8.7* 9.5* 8.1* 10.3*  HGB 11.9* 11.9* 11.9* 11.9* 13.1  HCT 36.2 35.9* 35.6* 35.2* 39.0  MCV 90.3 89.1 89.4 89.8 89.2  PLT 227 333 377 402* 504*   Basic Metabolic Panel: Recent Labs  Lab 11/01/19 1447 11/03/19 0346 11/04/19 0627 11/05/19 0551 11/06/19 0503  NA 136 135 139 137 138  K 4.1 4.0 3.7 4.3 4.6  CL 98 97* 103 103 102   CO2 28 27 27 27 28   GLUCOSE 106* 133* 184* 131* 131*  BUN 12 24* 24* 28* 27*  CREATININE 0.76 0.74 0.82 0.80 0.82  CALCIUM 8.7* 9.2 8.7* 8.6* 9.0   GFR: Estimated Creatinine Clearance: 73.7 mL/min (by C-G formula based on SCr of 0.82 mg/dL). Liver Function Tests: Recent Labs  Lab 11/01/19 1447 11/03/19 0346 11/04/19 0627 11/05/19 0551 11/06/19 0503  AST 46* 39 57* 41 28  ALT 21 28 48* 54* 51*  ALKPHOS 40 34* 34* 34* 35*  BILITOT 0.6 0.6 0.5 0.7 0.8  PROT 7.5 7.2 6.8 6.5 7.0  ALBUMIN 3.6 3.4* 3.2* 3.1* 3.2*   No results for input(s): LIPASE, AMYLASE in the last 168 hours. No results for input(s): AMMONIA in the last 168 hours. Coagulation Profile: No results for input(s): INR, PROTIME in the last 168 hours. Cardiac Enzymes: No results for input(s): CKTOTAL, CKMB, CKMBINDEX, TROPONINI in the last 168 hours.  BNP (last 3 results) No results for input(s): PROBNP in the last 8760 hours. HbA1C: No results for input(s): HGBA1C in the last 72 hours. CBG: No results for input(s): GLUCAP in the last 168 hours. Lipid Profile: No results for input(s): CHOL, HDL, LDLCALC, TRIG, CHOLHDL, LDLDIRECT in the last 72 hours. Thyroid Function Tests: No results for input(s): TSH, T4TOTAL, FREET4, T3FREE, THYROIDAB in the last 72 hours. Anemia Panel: Recent Labs    11/05/19 0551 11/06/19 0503  FERRITIN 312* 319*   Sepsis Labs: Recent Labs  Lab 11/01/19 1448  PROCALCITON <0.10  LATICACIDVEN 0.8    Recent Results (from the past 240 hour(s))  SARS Coronavirus 2 by RT PCR (hospital order, performed in [facility] hospital lab) Nasopharyngeal Nasopharyngeal Swab     Status: Abnormal   Collection Time: 11/01/19  3:05 PM   Specimen: Nasopharyngeal Swab  Result Value Ref Range Status   SARS Coronavirus 2 POSITIVE (A) NEGATIVE Final    Comment: RESULT CALLED TO, READ BACK BY AND VERIFIED WITH: CHRIS BENNETT AT 1753 ON 11/01/19 RWW (NOTE) SARS-CoV-2 target nucleic acids are  DETECTED  SARS-CoV-2 RNA is generally detectable in upper respiratory specimens  during the acute phase of infection.  Positive results are indicative  of the presence of the identified virus, but do not rule out bacterial infection or co-infection with other pathogens not detected by the test.  Clinical correlation with patient history and  other diagnostic information is necessary to determine patient infection status.  The expected result is negative.  Fact Sheet for Patients:   BoilerBrush.com.cy   Fact Sheet for Healthcare Providers:   https://pope.com/    This test is not yet approved or cleared by the [place] FDA and  has been authorized for detection and/or diagnosis of SARS-CoV-2 by FDA under an Emergency Use Authorization (EUA).  This EUA will remain in effect (meaning this  test can be used) for the duration of  the COVID-19 declaration under Section 564(b)(1) of the Act, 21 U.S.C. section 360-bbb-3(b)(1), unless the authorization is terminated or revoked sooner.  Performed at [facility], [address], [place], [place] [address]          Radiology Studies: No results found.      Scheduled Meds: . albuterol  1 puff Inhalation Q4H  . vitamin C  500 mg Oral Daily  . baricitinib  4 mg Oral Daily  . chlorpheniramine-HYDROcodone  5 mL Oral Q12H  . enoxaparin (LOVENOX) injection  40 mg Subcutaneous Q24H  . feeding supplement  1 Container Oral TID BM  . Ipratropium-Albuterol  1 puff Inhalation Q6H  . lisinopril  5 mg Oral Daily  . loratadine  10 mg Oral Daily  . methylPREDNISolone (SOLU-MEDROL) injection  40 mg Intravenous Q12H  . montelukast  10 mg Oral QHS  . multivitamin with minerals  1 tablet Oral Daily  . zinc sulfate  220 mg Oral Daily   Continuous Infusions:   LOS: 5 days    Time spent:40 min    Trinity Haun, Roselind Messier, MD [facility] Pager [phone]  If 7PM-7AM, please  contact night-coverage [email or web address] Password [facility] 11/06/2019, 8:54 AM

## 2019-11-06 NOTE — Progress Notes (Signed)
Took over pt care at this time, pt A&O, proning, no needs at this time

## 2019-11-07 LAB — LACTATE DEHYDROGENASE: LDH: 209 U/L — ABNORMAL HIGH (ref 98–192)

## 2019-11-07 LAB — CBC WITH DIFFERENTIAL/PLATELET
Abs Immature Granulocytes: 1.75 10*3/uL — ABNORMAL HIGH (ref 0.00–0.07)
Basophils Absolute: 0.1 10*3/uL (ref 0.0–0.1)
Basophils Relative: 1 %
Eosinophils Absolute: 0 10*3/uL (ref 0.0–0.5)
Eosinophils Relative: 0 %
HCT: 38.4 % (ref 36.0–46.0)
Hemoglobin: 12.7 g/dL (ref 12.0–15.0)
Immature Granulocytes: 10 %
Lymphocytes Relative: 9 %
Lymphs Abs: 1.5 10*3/uL (ref 0.7–4.0)
MCH: 29.9 pg (ref 26.0–34.0)
MCHC: 33.1 g/dL (ref 30.0–36.0)
MCV: 90.4 fL (ref 80.0–100.0)
Monocytes Absolute: 1.1 10*3/uL — ABNORMAL HIGH (ref 0.1–1.0)
Monocytes Relative: 6 %
Neutro Abs: 12.8 10*3/uL — ABNORMAL HIGH (ref 1.7–7.7)
Neutrophils Relative %: 74 %
Platelets: 533 10*3/uL — ABNORMAL HIGH (ref 150–400)
RBC: 4.25 MIL/uL (ref 3.87–5.11)
RDW: 12.5 % (ref 11.5–15.5)
Smear Review: NORMAL
WBC: 17.2 10*3/uL — ABNORMAL HIGH (ref 4.0–10.5)
nRBC: 0 % (ref 0.0–0.2)

## 2019-11-07 LAB — COMPREHENSIVE METABOLIC PANEL
ALT: 46 U/L — ABNORMAL HIGH (ref 0–44)
AST: 23 U/L (ref 15–41)
Albumin: 3.1 g/dL — ABNORMAL LOW (ref 3.5–5.0)
Alkaline Phosphatase: 37 U/L — ABNORMAL LOW (ref 38–126)
Anion gap: 6 (ref 5–15)
BUN: 24 mg/dL — ABNORMAL HIGH (ref 6–20)
CO2: 28 mmol/L (ref 22–32)
Calcium: 8.9 mg/dL (ref 8.9–10.3)
Chloride: 104 mmol/L (ref 98–111)
Creatinine, Ser: 0.71 mg/dL (ref 0.44–1.00)
GFR calc Af Amer: >60 mL/min (ref >60)
GFR calc non Af Amer: >60 mL/min (ref >60)
Glucose, Bld: 145 mg/dL — ABNORMAL HIGH (ref 70–99)
Potassium: 5.2 mmol/L — ABNORMAL HIGH (ref 3.5–5.1)
Sodium: 138 mmol/L (ref 135–145)
Total Bilirubin: 0.6 mg/dL (ref 0.3–1.2)
Total Protein: 6.6 g/dL (ref 6.5–8.1)

## 2019-11-07 LAB — MAGNESIUM: Magnesium: 2.3 mg/dL (ref 1.7–2.4)

## 2019-11-07 LAB — FERRITIN: Ferritin: 285 ng/mL (ref 11–307)

## 2019-11-07 LAB — FIBRIN DERIVATIVES D-DIMER (ARMC ONLY): Fibrin derivatives D-dimer (ARMC): 494.7 ng/mL (FEU) (ref 0.00–499.00)

## 2019-11-07 LAB — C-REACTIVE PROTEIN: CRP: 0.5 mg/dL (ref <1.0)

## 2019-11-07 LAB — PHOSPHORUS: Phosphorus: 3.7 mg/dL (ref 2.5–4.6)

## 2019-11-07 MED ORDER — SALINE SPRAY 0.65 % NA SOLN: 1.0000 | NASAL | 0 refills | Status: AC | PRN

## 2019-11-07 MED ORDER — DEXAMETHASONE 4 MG PO TABS: 4.0000 mg | ORAL_TABLET | Freq: Every day | ORAL | 0 refills | Status: AC

## 2019-11-07 MED ORDER — BARICITINIB 2 MG PO TABS
4.0000 mg | ORAL_TABLET | Freq: Every day | ORAL | 0 refills | Status: DC
Start: 2019-11-08 — End: 2019-11-07

## 2019-11-07 MED ORDER — ZINC SULFATE 220 (50 ZN) MG PO CAPS: 220.0000 mg | ORAL_CAPSULE | Freq: Every day | ORAL | 0 refills | Status: AC

## 2019-11-07 MED ORDER — IPRATROPIUM-ALBUTEROL 20-100 MCG/ACT IN AERS: 1.0000 | INHALATION_SPRAY | Freq: Four times a day (QID) | RESPIRATORY_TRACT | 0 refills | Status: AC

## 2019-11-07 MED ORDER — ASCORBIC ACID 500 MG PO TABS: 500.0000 mg | ORAL_TABLET | Freq: Every day | ORAL | 0 refills | Status: AC

## 2019-11-07 MED ORDER — DEXAMETHASONE 4 MG PO TABS
4.0000 mg | ORAL_TABLET | Freq: Every day | ORAL | Status: DC
  Administered 2019-11-07: 16:00:00 4 mg via ORAL
  Filled 2019-11-07: qty 1

## 2019-11-07 MED ORDER — GUAIFENESIN-DM 100-10 MG/5ML PO SYRP: 10.0000 mL | ORAL_SOLUTION | ORAL | 0 refills | Status: AC | PRN

## 2019-11-07 NOTE — Progress Notes (Signed)
SATURATION QUALIFICATIONS: (This note is used to comply with regulatory documentation for home oxygen)  Patient Saturations on Room Air at Rest = 95%  Patient Saturations on Room Air while Ambulating = 90%  Patient Saturations on 2 Liters of oxygen while Ambulating = 100%  Please briefly explain why patient needs home oxygen:   Pt tolerating ambulation without supplemental O2. She was weaned to 2L [place] then taken off O2 completely. Pt O2 saturation fluctuates at rest between 95-98% on RA

## 2019-11-07 NOTE — Discharge Summary (Addendum)
Physician Discharge Summary  Kyona Chauncey QTM:[id number] DOB: June 25, 1960 DOA: 11/01/2019  PCP: Patient, No Pcp Per  Admit date: 11/01/2019 Discharge date: 11/07/2019  Time spent: 35 minutes  Recommendations for Outpatient Follow-up:   Covid vaccination; negative vaccination    Acute respiratory failure with hypoxia/Covid 19 pneumonia COVID-19 Labs  Recent Labs    11/06/19 0503 11/06/19 1017 11/07/19 0449  FERRITIN 319* 296 285  LDH  --  222* 209*  CRP <0.5 0.7 0.5    Lab Results  Component Value Date   SARSCOV2NAA POSITIVE (A) 11/01/2019   SARSCOV2NAA Not Detected 12/20/2018    Acute hypoxic respiratory failure secondary to COVID-19 pneumonia -Completed course Remdesivir today8/27/2021-11/05/2019 -Continue Decadron 4 mg 11/01/2019-11/11/2019 -Continue Baricitinib, 4 mg daily 11/01/2019-11/07/2019 -Vitamin C 500 mg daily -Zinc sulfate 220 mcg daily -Combivent QID -Robitussin-DM -SATURATION QUALIFICATIONS: (This note is used to comply with regulatory documentation for home oxygen) Patient Saturations on Room Air at Rest = 95% Patient Saturations on [employer] while Ambulating = 90% Patient Saturations on 2 Liters of oxygen while Ambulating = 100% Please briefly explain why patient needs home oxygen:  Pt tolerating ambulation without supplemental O2. She was weaned to 2L [place] then taken off O2 completely. Pt O2 saturation fluctuates at rest between 95-98% on RA  -Does not meet criteria for home O2 -Patient is considered CONTAGIOUS until 11/22/2019 should take universal precautions against contaminating family members such as wearing masks, frequent handwashing, quarantining to 1 section of the house. -Follow-up with PCP after 9/17 to discuss obtaining vaccination.  Discussed in length with patient the reasoning for obtaining Covid vaccination.  Essential HTN -Lisinopril 5 mg daily  Allergic rhinitis -Zyrtec 10 mg daily -Singular 10 mg daily     Discharge Diagnoses:   Principal Problem:   Acute respiratory failure with hypoxia (HCC) Active Problems:   Pneumonia due to COVID-19 virus   Essential hypertension   Seasonal allergies   Discharge Condition: Stable  Diet recommendation: Heart healthy  Filed Weights   11/01/19 1438  Weight: 86.2 kg    History of present illness:  59 y.o. WF PMHx HTN,   Presents with dyspnea. Patient not feeling well since August 18, positive aches and pains, malaise and generalized weakness. 4 days later on August 22 she tested positive for COVID-19. For the last 5 days her symptoms continue to deteriorate, with decreased p.o. intake, fevers, and chills. Positive decreased energy, thenshe has been mostly laying in bed.  For the last 48 hours she hasbeenexperiencingdyspnea, moderate to severe in intensity, associated with dry cough, no improving or worsening factors.  Today she went to urgent care, she was found hypoxic 88% on room air on ambulation. A chest radiograph was performed revealing bilateral interstitial infiltrates. Patient was placed on 4 L of supplemental oxygen per nasal cannula and transported to the emergency department.  8/28: Patient seen and examined. Remained stable. On 3 to 4 L nasal cannula. Endorsing some shortness of breath improving over interval.  8/29: Saturation slightly worse today. On 5 L nasal cannula. Having some issues with anxiety. Appeared fatigued on my evaluation today.  8/30: Seen and examined. 6 L high flow nasal cannula.   8/31:Seen and examined. Remains on 6 L high flow nasal cannula. Appears fatigued.   Hospital Course:  See above  Cultures   8/27 SARS coronavirus positive  8/28 HIV screen negative   Antibiotics Anti-infectives (From admission, onward)   Start     Ordered Stop   11/02/19 1000  remdesivir  100 mg in sodium chloride 0.9 % 100 mL IVPB       "Followed by" Linked Group Details   11/01/19 1732 11/05/19 0850   11/01/19 1900   remdesivir 200 mg in sodium chloride 0.9% 250 mL IVPB       "Followed by" Linked Group Details   11/01/19 1732 11/01/19 2230       Discharge Exam: Vitals:   11/06/19 1800 11/06/19 2013 11/07/19 0812 11/07/19 1148  BP:   105/66 113/71  Pulse: 62 60 (!) 46 77  Resp: 17 18 18  (!) 22  Temp:  98.1 F (36.7 C) 97.9 F (36.6 C) 98.6 F (37 C)  TempSrc:  Oral Oral Oral  SpO2: 99% 93% 98% 91%  Weight:      Height:        General: A/O x4.positive acute respiratory distress Eyes: negative scleral hemorrhage, negative anisocoria, negative icterus ENT: Negative Runny nose, negative gingival bleeding, Neck:  Negative scars, masses, torticollis, lymphadenopathy, JVD Lungs:  clear to auscultation bilaterally without wheezes or crackles Cardiovascular: Regular rate and rhythm without murmur gallop or rub normal S1 and S2  Discharge Instructions   Allergies as of 11/07/2019      Reactions   Lac Bovis Other (See Comments)   Other reaction(s): Other (See Comments) GiI issues GiI issues   Milk-related Compounds    Thimerosal Other (See Comments)   Other reaction(s): Other (See Comments) Vision disturbances Vision disturbances      Medication List    TAKE these medications   albuterol 108 (90 Base) MCG/ACT inhaler Commonly known as: VENTOLIN HFA Inhale 2 puffs into the lungs every 6 (six) hours as needed.   ascorbic acid 500 MG tablet Commonly known as: VITAMIN C Take 1 tablet (500 mg total) by mouth daily. Start taking on: November 08, 2019   cetirizine 10 MG tablet Commonly known as: ZYRTEC Take 1 tablet by mouth daily.   Cholecalciferol 125 MCG (5000 UT) Tabs Take 1 tablet by mouth daily.   dexamethasone 4 MG tablet Commonly known as: DECADRON Take 1 tablet (4 mg total) by mouth daily.   fluticasone 50 MCG/ACT nasal spray Commonly known as: FLONASE Place into both nostrils daily.   guaiFENesin-dextromethorphan 100-10 MG/5ML syrup Commonly known as: ROBITUSSIN  DM Take 10 mLs by mouth every 4 (four) hours as needed for cough.   Ipratropium-Albuterol 20-100 MCG/ACT Aers respimat Commonly known as: COMBIVENT Inhale 1 puff into the lungs every 6 (six) hours.   lisinopril 5 MG tablet Commonly known as: ZESTRIL Take 5 mg by mouth daily.   montelukast 10 MG tablet Commonly known as: SINGULAIR Take 10 mg by mouth at bedtime.   sodium chloride 0.65 % Soln nasal spray Commonly known as: OCEAN Place 1 spray into both nostrils as needed for congestion.   zinc sulfate 220 (50 Zn) MG capsule Take 1 capsule (220 mg total) by mouth daily. Start taking on: November 08, 2019      Allergies  Allergen Reactions  . Lac Bovis Other (See Comments)    Other reaction(s): Other (See Comments) GiI issues GiI issues   . Milk-Related Compounds   . Thimerosal Other (See Comments)    Other reaction(s): Other (See Comments) Vision disturbances Vision disturbances       The results of significant diagnostics from this hospitalization (including imaging, microbiology, ancillary and laboratory) are listed below for reference.    Significant Diagnostic Studies: DG Chest 2 View  Result Date: 11/01/2019 CLINICAL DATA:  Patient  diagnosed with COVID couple days ago. Patient c/o cough, chest congestion and SOB. EXAM: CHEST - 2 VIEW COMPARISON:  Chest radiograph 08/31/2019 FINDINGS: The heart size and mediastinal contours are within normal limits. There are bilateral infiltrates predominantly in the mid to lower lungs. No pneumothorax or significant pleural effusion. No acute finding in the visualized skeleton. IMPRESSION: Infiltrates in the bilateral mid to lower lungs concerning for multifocal pneumonia. Electronically Signed   By: Emmaline Kluver M.D.   On: 11/01/2019 11:48    Microbiology: Recent Results (from the past 240 hour(s))  SARS Coronavirus 2 by RT PCR (hospital order, performed in [facility] hospital lab) Nasopharyngeal Nasopharyngeal Swab      Status: Abnormal   Collection Time: 11/01/19  3:05 PM   Specimen: Nasopharyngeal Swab  Result Value Ref Range Status   SARS Coronavirus 2 POSITIVE (A) NEGATIVE Final    Comment: RESULT CALLED TO, READ BACK BY AND VERIFIED WITH: CHRIS BENNETT AT 1753 ON 11/01/19 RWW (NOTE) SARS-CoV-2 target nucleic acids are DETECTED  SARS-CoV-2 RNA is generally detectable in upper respiratory specimens  during the acute phase of infection.  Positive results are indicative  of the presence of the identified virus, but do not rule out bacterial infection or co-infection with other pathogens not detected by the test.  Clinical correlation with patient history and  other diagnostic information is necessary to determine patient infection status.  The expected result is negative.  Fact Sheet for Patients:   BoilerBrush.com.cy   Fact Sheet for Healthcare Providers:   https://pope.com/    This test is not yet approved or cleared by the [place] FDA and  has been authorized for detection and/or diagnosis of SARS-CoV-2 by FDA under an Emergency Use Authorization (EUA).  This EUA will remain in effect (meaning this  test can be used) for the duration of  the COVID-19 declaration under Section 564(b)(1) of the Act, 21 U.S.C. section 360-bbb-3(b)(1), unless the authorization is terminated or revoked sooner.  Performed at [facility], [address] [place], [place] [address]      Labs: Basic Metabolic Panel: Recent Labs  Lab 11/03/19 0346 11/04/19 0627 11/05/19 0551 11/06/19 0503 11/06/19 1017 11/07/19 0449  NA 135 139 137 138  --  138  K 4.0 3.7 4.3 4.6  --  5.2*  CL 97* 103 103 102  --  104  CO2 27 27 27 28   --  28  GLUCOSE 133* 184* 131* 131*  --  145*  BUN 24* 24* 28* 27*  --  24*  CREATININE 0.74 0.82 0.80 0.82  --  0.71  CALCIUM 9.2 8.7* 8.6* 9.0  --  8.9  MG  --   --   --   --  2.5* 2.3  PHOS  --   --   --   --  2.9 3.7    Liver Function Tests: Recent Labs  Lab 11/03/19 0346 11/04/19 0627 11/05/19 0551 11/06/19 0503 11/07/19 0449  AST 39 57* 41 28 23  ALT 28 48* 54* 51* 46*  ALKPHOS 34* 34* 34* 35* 37*  BILITOT 0.6 0.5 0.7 0.8 0.6  PROT 7.2 6.8 6.5 7.0 6.6  ALBUMIN 3.4* 3.2* 3.1* 3.2* 3.1*   No results for input(s): LIPASE, AMYLASE in the last 168 hours. No results for input(s): AMMONIA in the last 168 hours. CBC: Recent Labs  Lab 11/03/19 0346 11/04/19 0627 11/05/19 0551 11/06/19 0503 11/07/19 0449  WBC 11.1* 11.2* 10.8* 14.3* 17.2*  NEUTROABS 8.7*  9.5* 8.1* 10.3* 12.8*  HGB 11.9* 11.9* 11.9* 13.1 12.7  HCT 35.9* 35.6* 35.2* 39.0 38.4  MCV 89.1 89.4 89.8 89.2 90.4  PLT 333 377 402* 504* 533*   Cardiac Enzymes: No results for input(s): CKTOTAL, CKMB, CKMBINDEX, TROPONINI in the last 168 hours. BNP: BNP (last 3 results) No results for input(s): BNP in the last 8760 hours.  ProBNP (last 3 results) No results for input(s): PROBNP in the last 8760 hours.  CBG: No results for input(s): GLUCAP in the last 168 hours.     Signed:  Carolyne Littles, MD [facility] [phone] pager

## 2020-03-26 IMAGING — CT CT MAXILLOFACIAL W/O CM
3 series · 15 of 47 positions shown, 18 images · non-contrast
Comparison: None.

CLINICAL DATA: Fall. Trauma to nose and left eye. Fell going down
steps.

EXAM:
CT HEAD WITHOUT CONTRAST
CT MAXILLOFACIAL WITHOUT CONTRAST
TECHNIQUE: Multidetector CT imaging of the head and maxillofacial structures
were performed using the standard protocol without intravenous
contrast. Multiplanar CT image reconstructions of the maxillofacial
structures were also generated.

[Series 2: max soft (person_name) · axial · 0.33mm/px · z∈[+400,+542]mm · 9 of 83 slices shown, 12 images]
[im 6/83  brain]
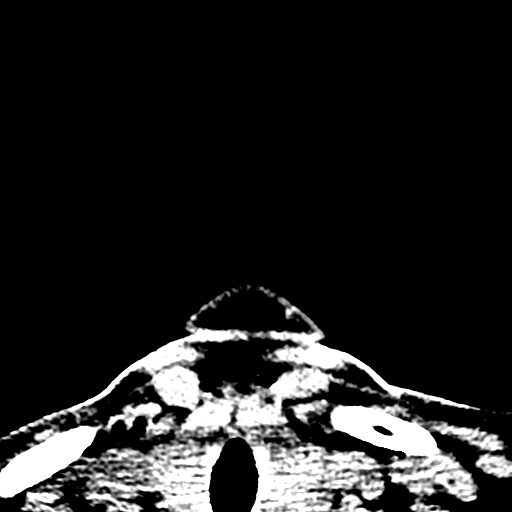
[im 6/83  bone]
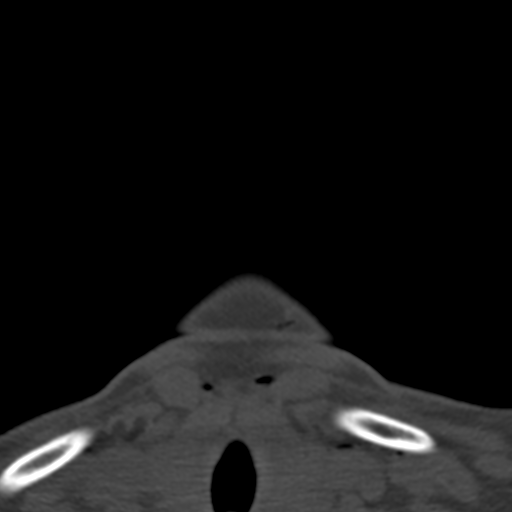
[im 15/83  bone]
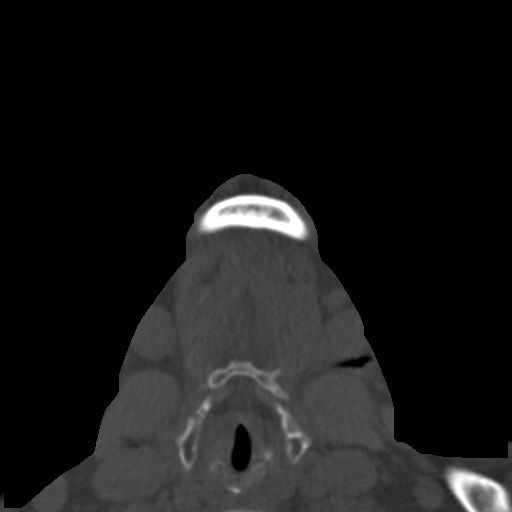
[im 23/83  bone]
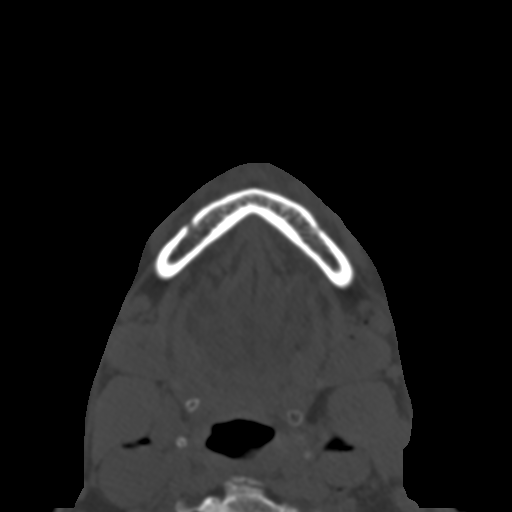
[im 32/83  bone]
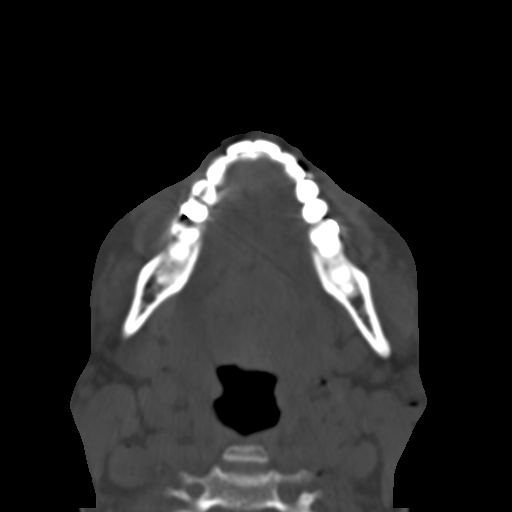
[im 43/83  brain]
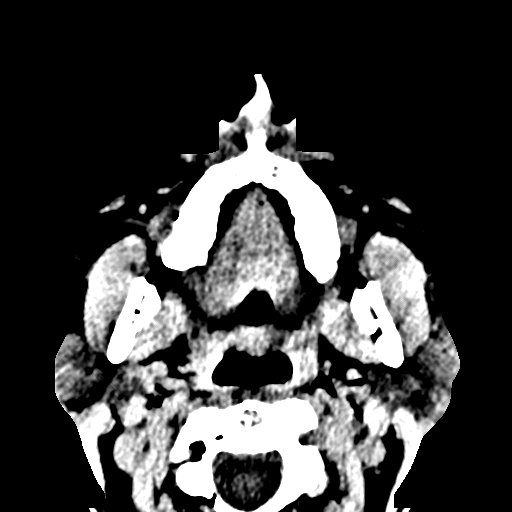
[im 43/83  bone]
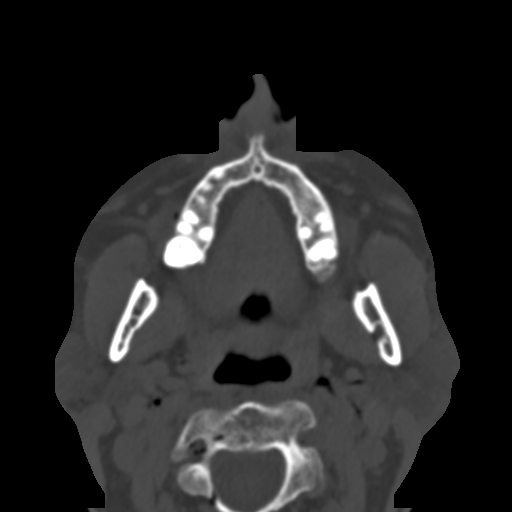
[im 51/83  bone]
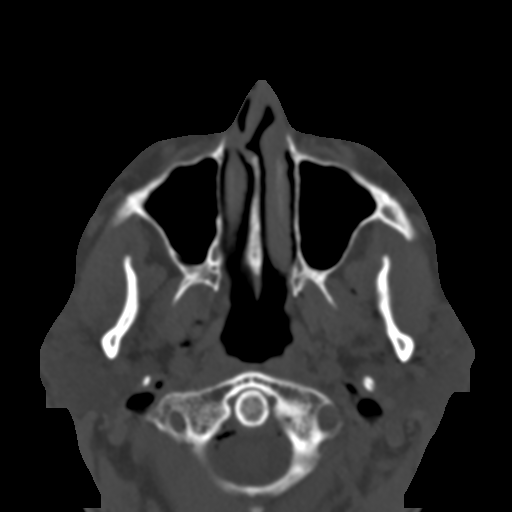
[im 60/83  bone]
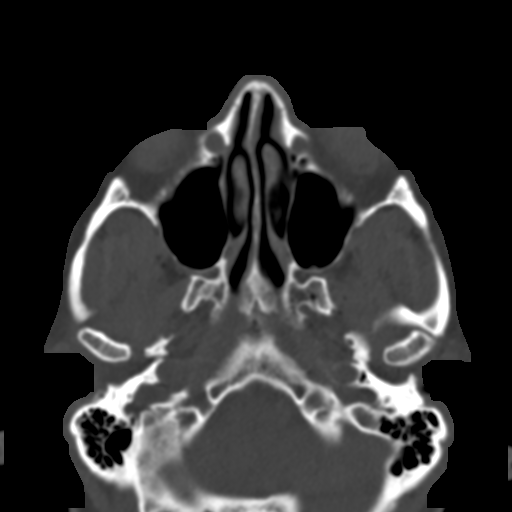
[im 68/83  bone]
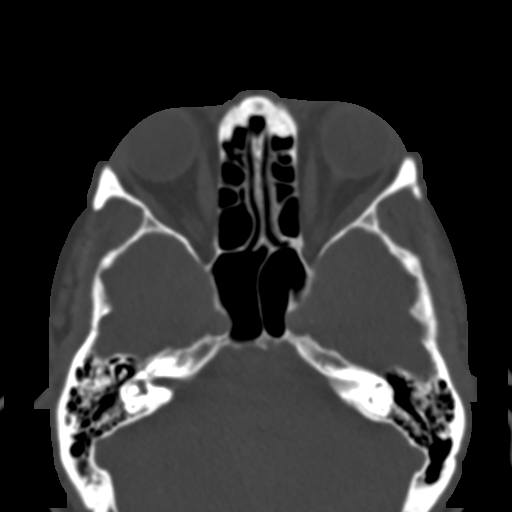
[im 77/83  brain]
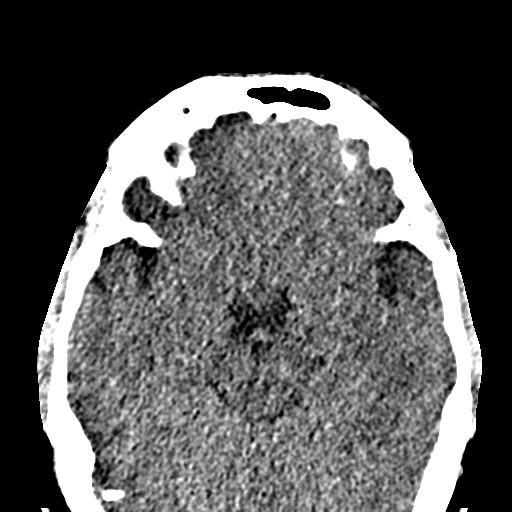
[im 77/83  bone]
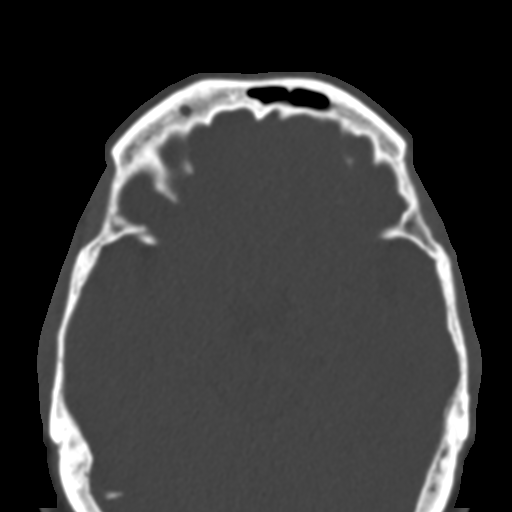

[Series 6: coronal soft · coronal · 0.38mm/px · 3 of 112 slices shown]
[im 40/112  bone]
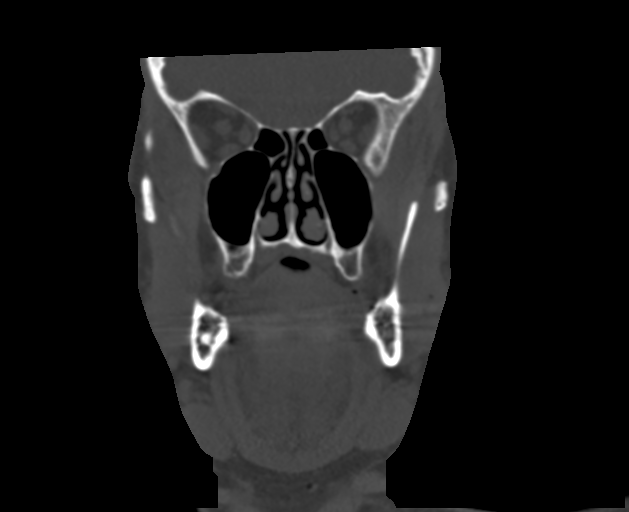
[im 51/112  bone]
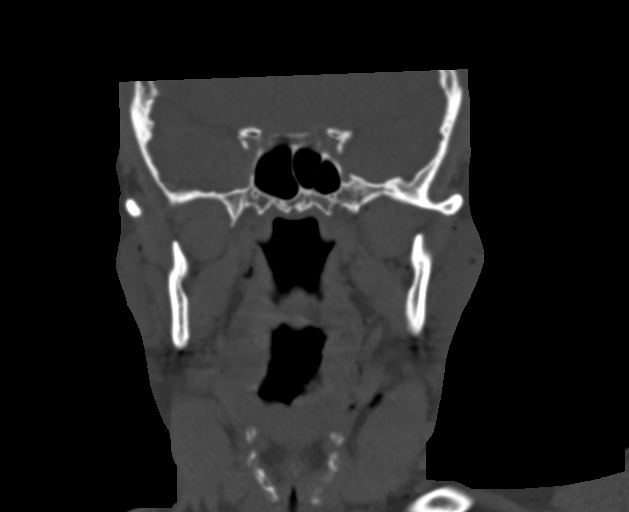
[im 63/112  bone]
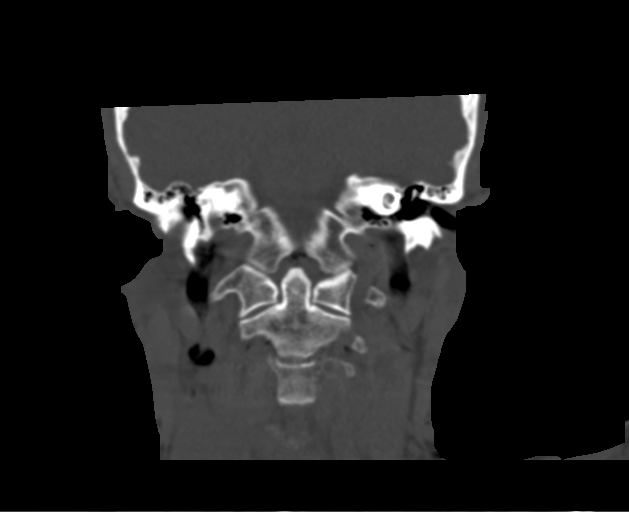

[Series 7: sagittal soft · sagittal · 0.38mm/px · 3 of 89 slices shown]
[im 30/89  bone]
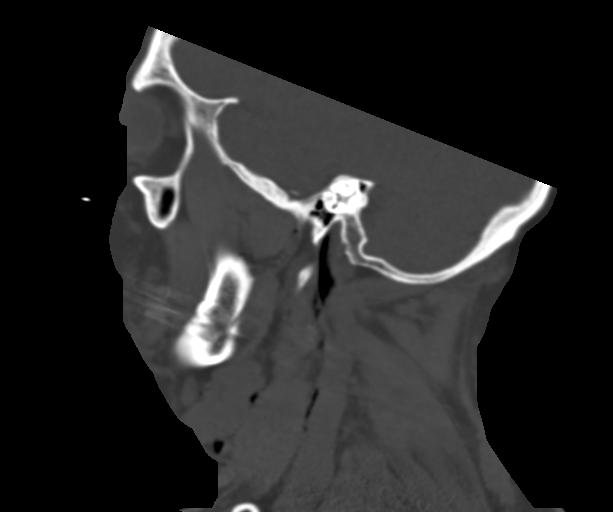
[im 45/89  bone]
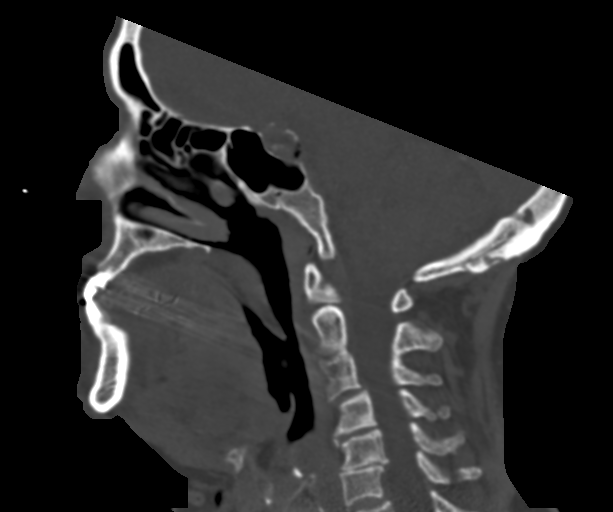
[im 59/89  bone]
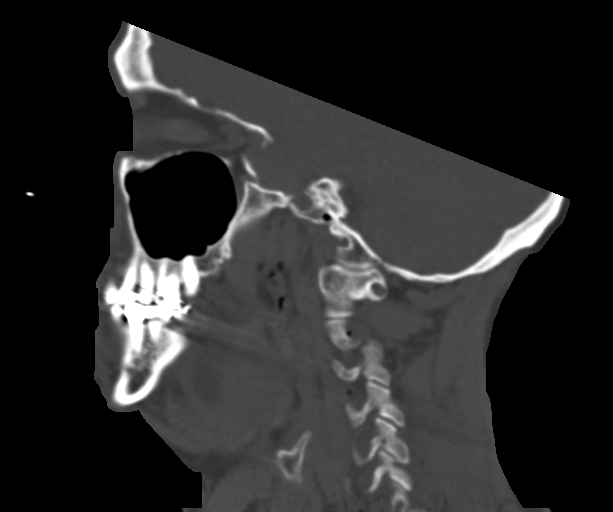

[15 of 47 positions shown; findings below may reference images not displayed]

FINDINGS: CT HEAD FINDINGS

Brain: No acute infarct, hemorrhage, or mass lesion is present. The
ventricles are of normal size. No significant white matter lesions
are present. No significant extraaxial fluid collection is present.
The brainstem and cerebellum are within normal limits.

Vascular: Atherosclerotic calcifications are present within the
cavernous internal carotid arteries bilaterally. There is no
hyperdense vessel.

Skull: Calvarium is intact. No focal lytic or blastic lesions are
present. Left periorbital soft tissue swelling is present without
underlying fracture.

CT MAXILLOFACIAL FINDINGS

Osseous: A minimally displaced anterior left nasal bone fracture is
present. No other acute fractures are present. Marked rightward
nasal septal deviation is related to remote trauma. The mandible is
intact and located. No focal lytic or blastic lesions are present.

Orbits: Left periorbital soft tissue swelling is present without
underlying fracture. The globes are within normal limits. Orbits are
otherwise normal.

Sinuses: The paranasal sinuses and mastoid air cells are clear.

Soft tissues: Soft tissue laceration is present at the site of the
nasal bone fracture. Soft tissue edema and hematoma is present.
IMPRESSION: 1. Minimally displaced anterior left nasal bone fracture with
associated soft tissue laceration and hematoma.
2. Left periorbital soft tissue swelling without underlying
fracture.
3. Normal CT appearance of the brain.

## 2020-03-26 IMAGING — CT CT HEAD W/O CM
3 series · 15 of 47 positions shown, 18 images · non-contrast
Comparison: None.

CLINICAL DATA: Fall. Trauma to nose and left eye. Fell going down
steps.

EXAM:
CT HEAD WITHOUT CONTRAST
CT MAXILLOFACIAL WITHOUT CONTRAST
TECHNIQUE: Multidetector CT imaging of the head and maxillofacial structures
were performed using the standard protocol without intravenous
contrast. Multiplanar CT image reconstructions of the maxillofacial
structures were also generated.

[Series 2: head wo · axial · 0.42mm/px · z∈[+536,+661]mm · 9 of 30 slices shown, 12 images]
[im 3/30  brain]
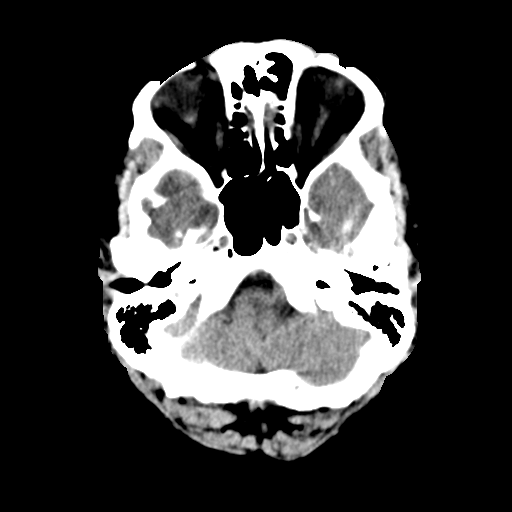
[im 3/30  bone]
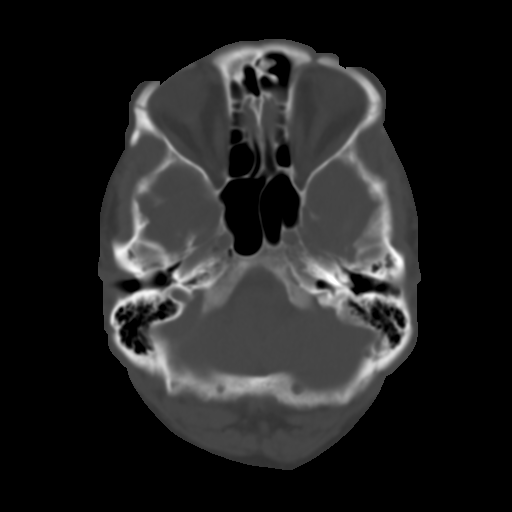
[im 6/30  brain]
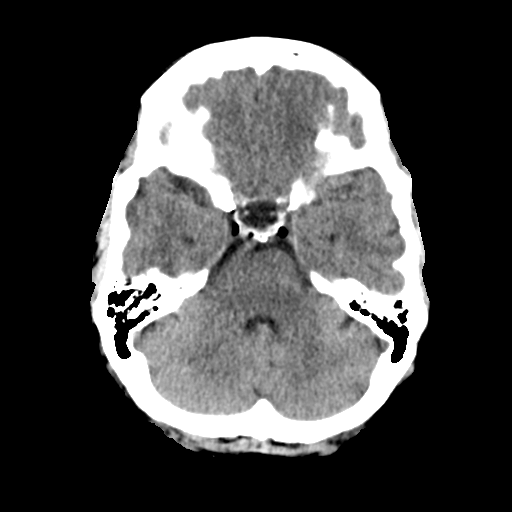
[im 9/30  brain]
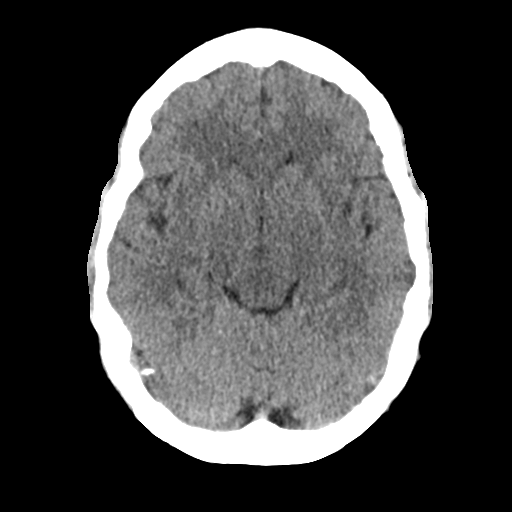
[im 12/30  brain]
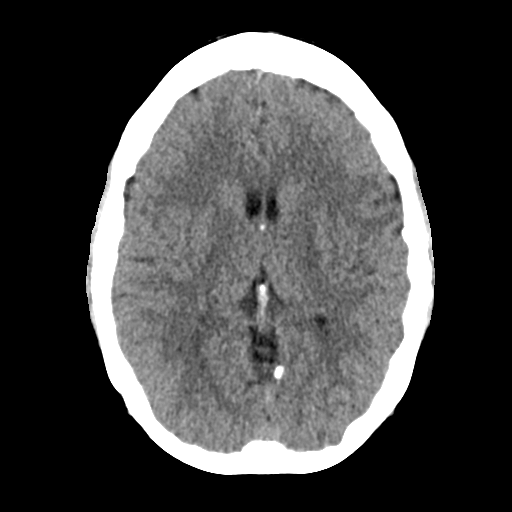
[im 16/30  brain]
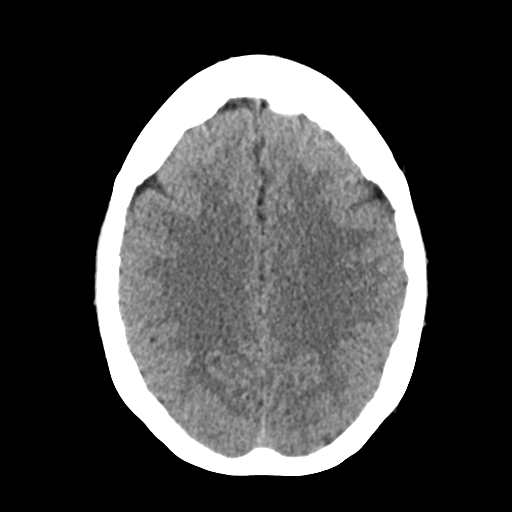
[im 16/30  bone]
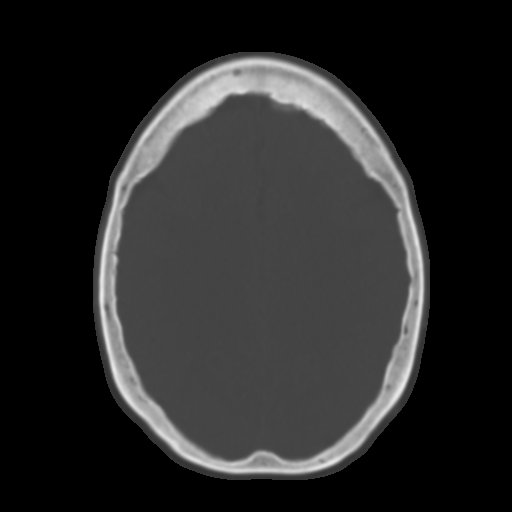
[im 19/30  brain]
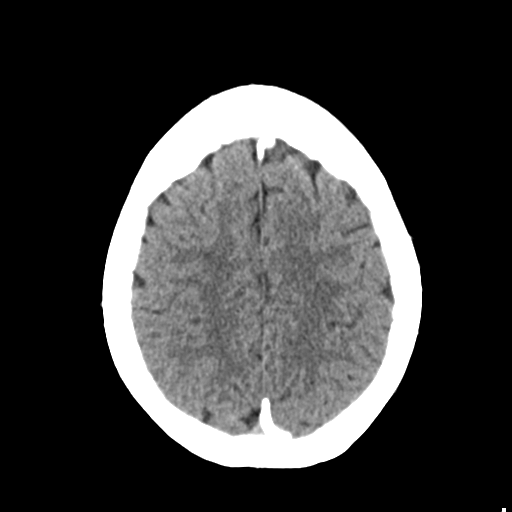
[im 22/30  brain]
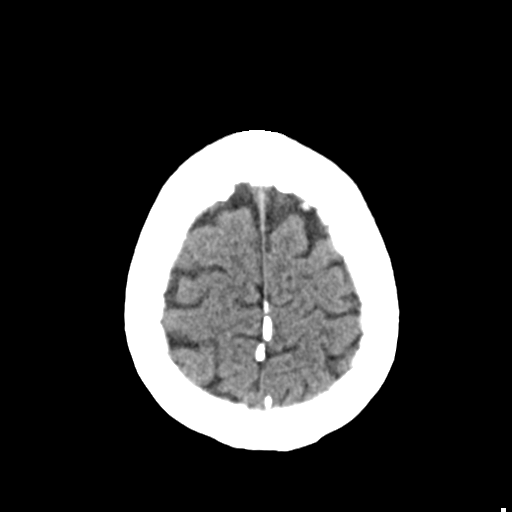
[im 25/30  brain]
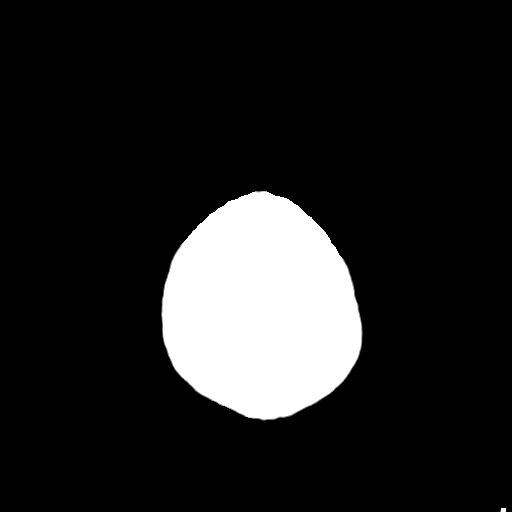
[im 28/30  brain]
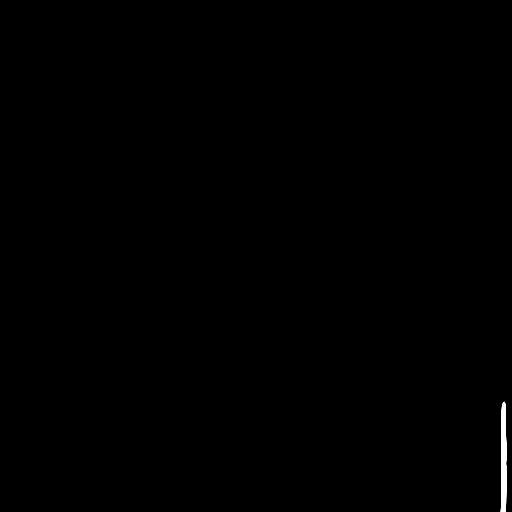
[im 28/30  bone]
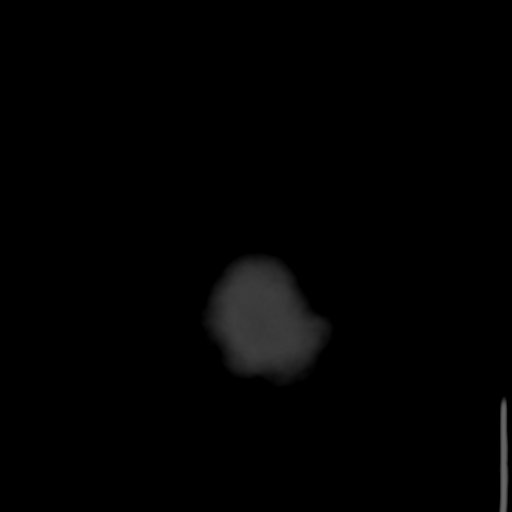

[Series 4: coronal soft tissue · coronal · 0.29mm/px · 3 of 66 slices shown]
[im 22/66  brain]
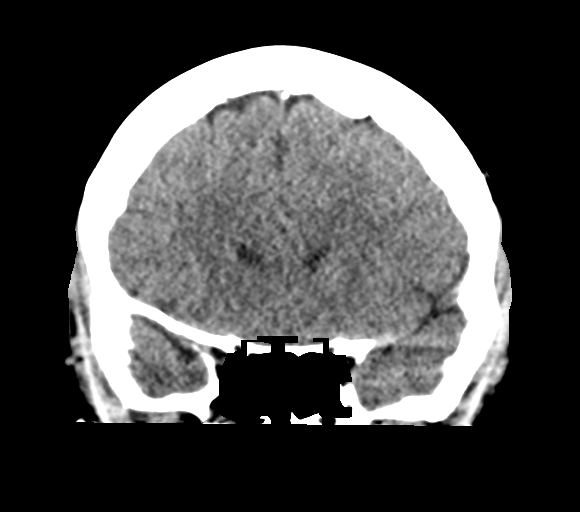
[im 29/66  brain]
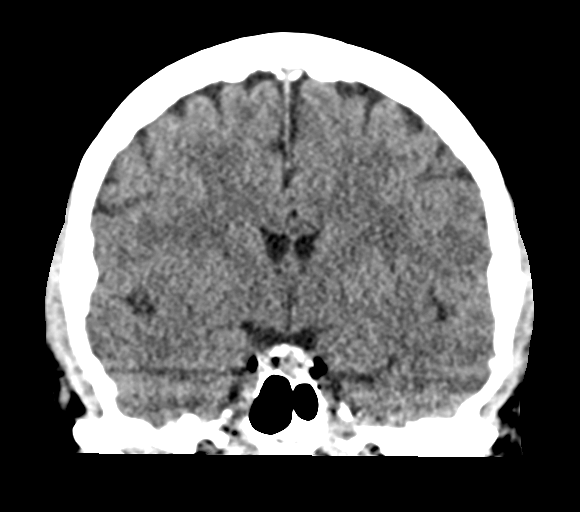
[im 37/66  brain]
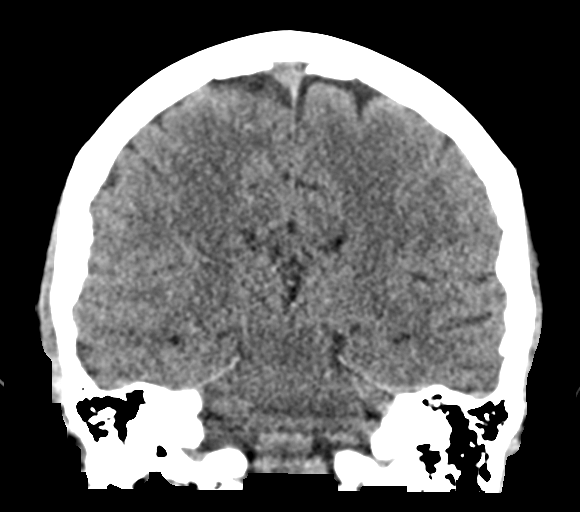

[Series 5: sagittal soft tissue · sagittal · 0.29mm/px · 3 of 54 slices shown]
[im 18/54  brain]
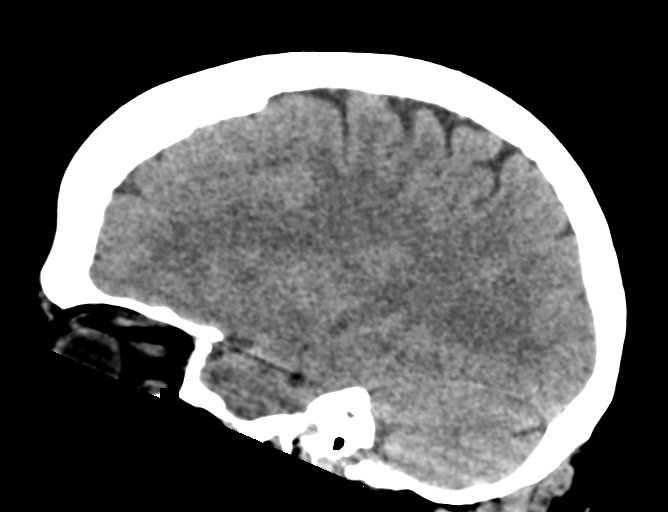
[im 27/54  brain]
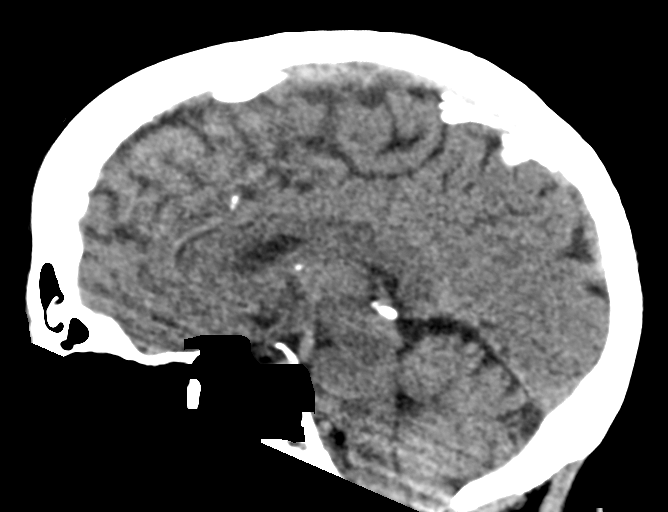
[im 36/54  brain]
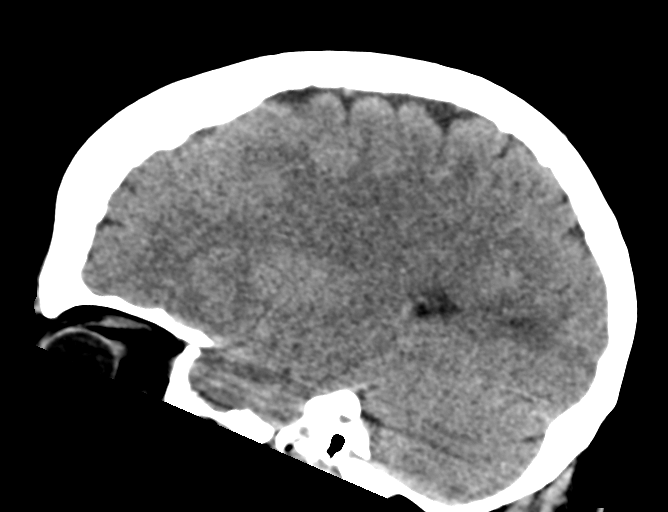

[15 of 47 positions shown; findings below may reference images not displayed]

FINDINGS: CT HEAD FINDINGS

Brain: No acute infarct, hemorrhage, or mass lesion is present. The
ventricles are of normal size. No significant white matter lesions
are present. No significant extraaxial fluid collection is present.
The brainstem and cerebellum are within normal limits.

Vascular: Atherosclerotic calcifications are present within the
cavernous internal carotid arteries bilaterally. There is no
hyperdense vessel.

Skull: Calvarium is intact. No focal lytic or blastic lesions are
present. Left periorbital soft tissue swelling is present without
underlying fracture.

CT MAXILLOFACIAL FINDINGS

Osseous: A minimally displaced anterior left nasal bone fracture is
present. No other acute fractures are present. Marked rightward
nasal septal deviation is related to remote trauma. The mandible is
intact and located. No focal lytic or blastic lesions are present.

Orbits: Left periorbital soft tissue swelling is present without
underlying fracture. The globes are within normal limits. Orbits are
otherwise normal.

Sinuses: The paranasal sinuses and mastoid air cells are clear.

Soft tissues: Soft tissue laceration is present at the site of the
nasal bone fracture. Soft tissue edema and hematoma is present.
IMPRESSION: 1. Minimally displaced anterior left nasal bone fracture with
associated soft tissue laceration and hematoma.
2. Left periorbital soft tissue swelling without underlying
fracture.
3. Normal CT appearance of the brain.

## 2020-09-15 IMAGING — CR DG CHEST 2V
2 series · 2 of 2 positions shown · non-contrast
Comparison: 06/07/2007

CLINICAL DATA: Productive cough for 4 days.

EXAM:
CHEST - 2 VIEW

[chest pa]
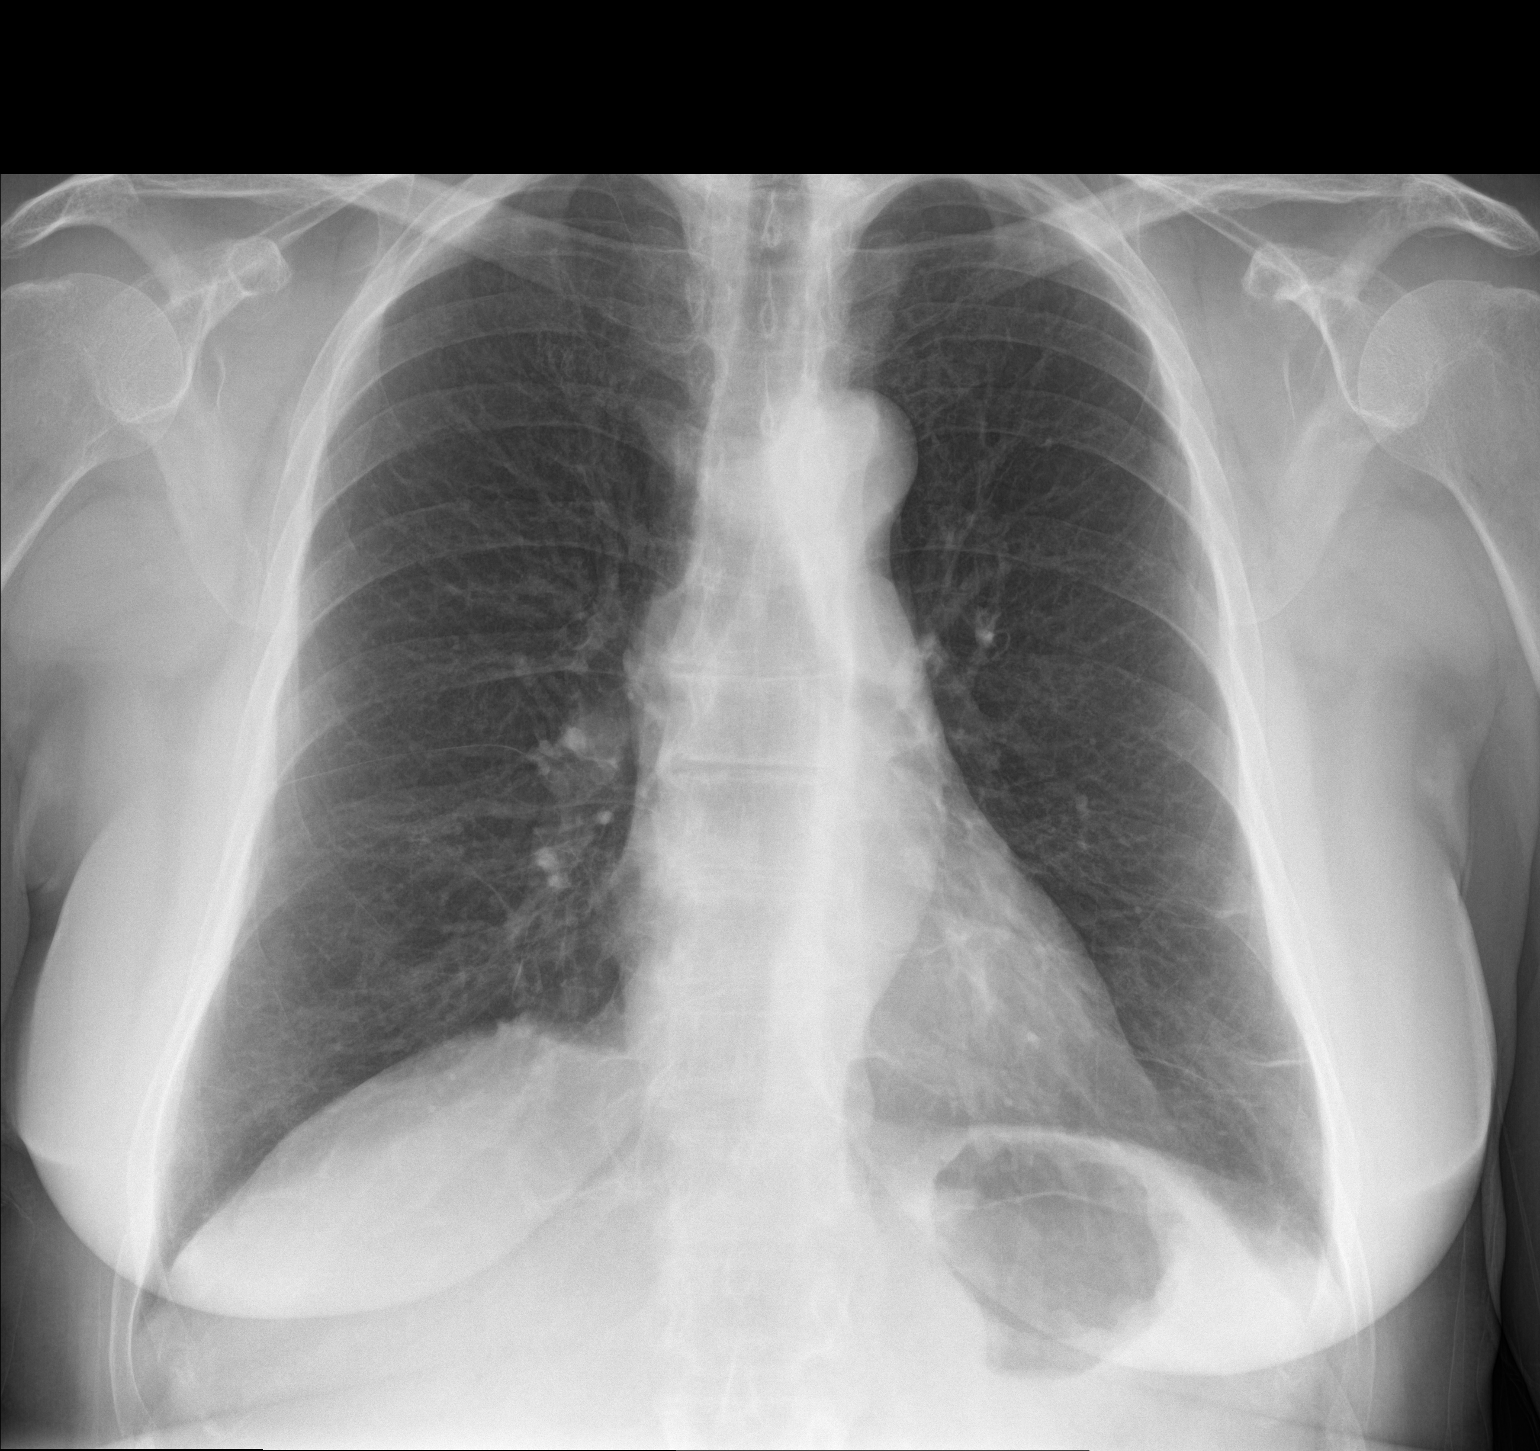

[chest lat]
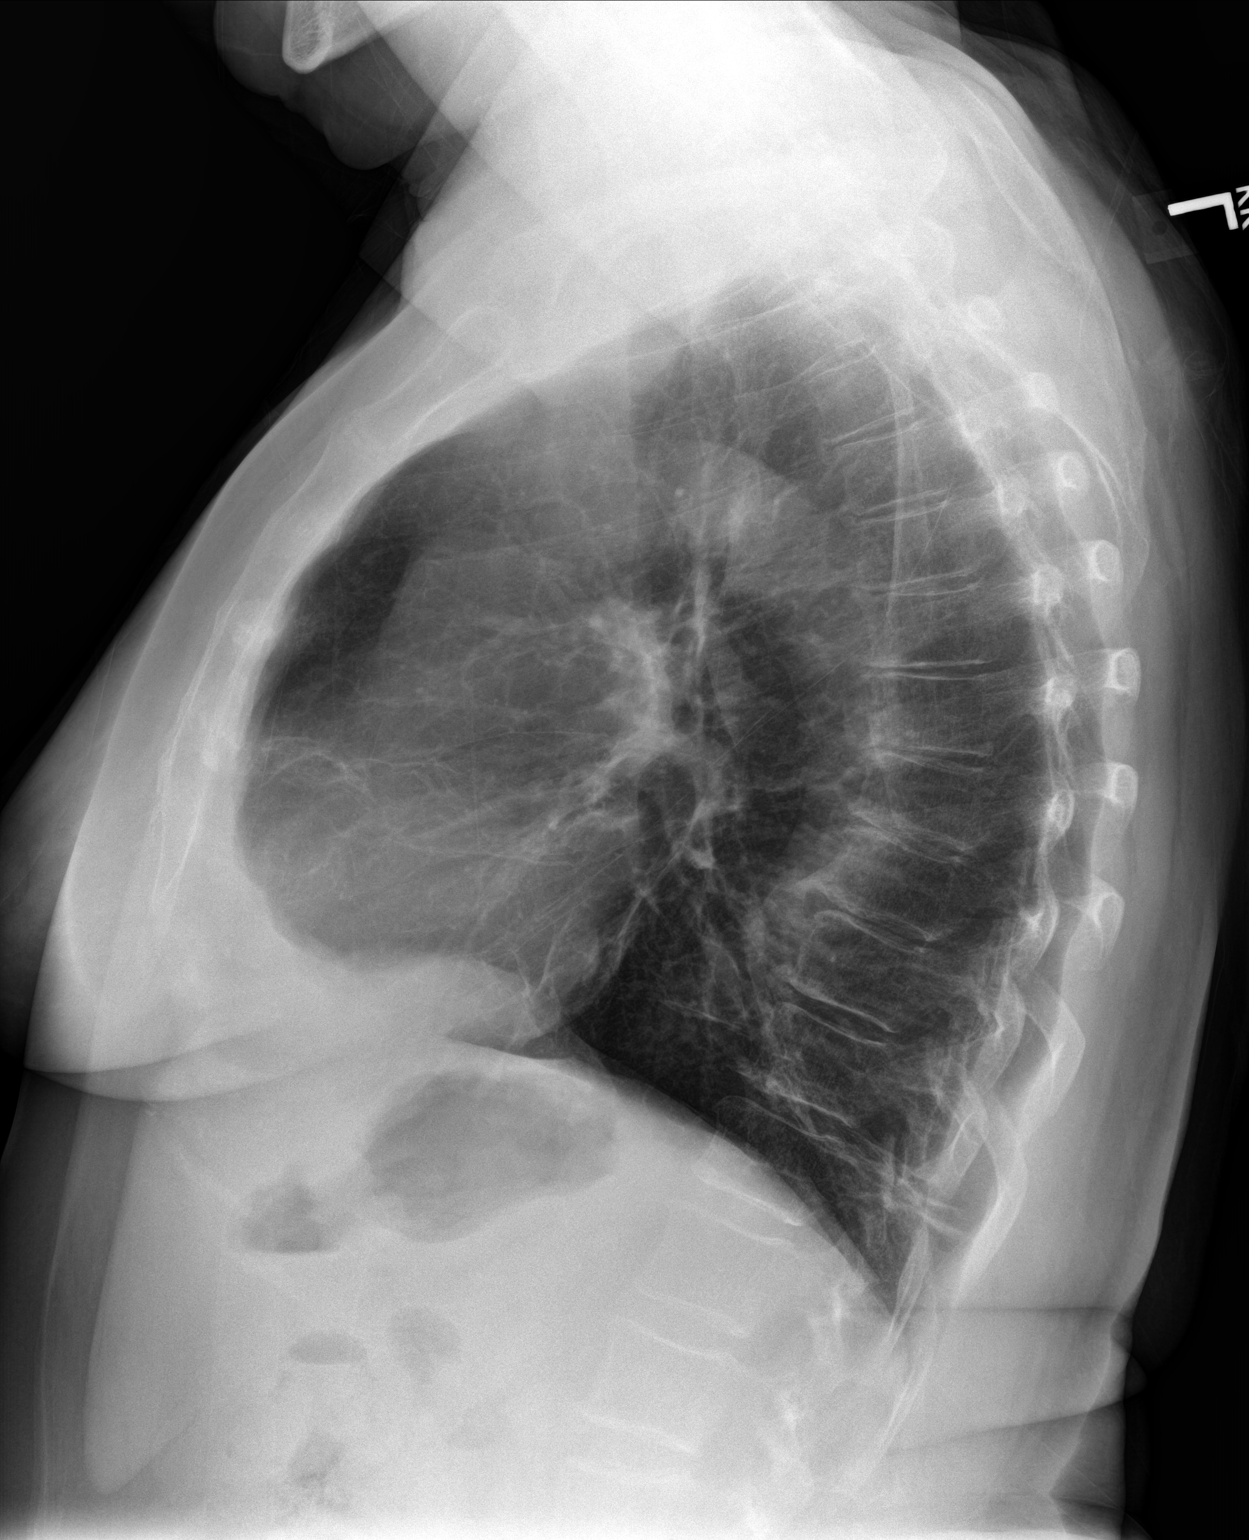

[2 of 2 positions shown; findings below may reference images not displayed]

FINDINGS: The heart size and mediastinal contours are within normal limits.
Scarring is noted in the lower lungs, left side greater than right.
No evidence of pulmonary infiltrate or edema. No evidence of pleural
effusion. The visualized skeletal structures are unremarkable.
IMPRESSION: No active cardiopulmonary disease.

## 2022-06-07 ENCOUNTER — Other Ambulatory Visit: Payer: Self-pay | Admitting: Family Medicine

## 2022-06-07 DIAGNOSIS — Z1231 Encounter for screening mammogram for malignant neoplasm of breast: Secondary | ICD-10-CM
